# Patient Record
Sex: Female | Born: 1950 | Race: White | Hispanic: No | Marital: Married | Smoking: Never smoker
Health system: Southern US, Community
[De-identification: ages and names within clinical notes are randomized; demographics above are authoritative.]

---

## 1998-06-04 ENCOUNTER — Other Ambulatory Visit: Admission: RE | Admit: 1998-06-04 | Discharge: 1998-06-04 | Payer: Self-pay | Admitting: Obstetrics and Gynecology

## 2000-02-21 ENCOUNTER — Encounter: Admission: RE | Admit: 2000-02-21 | Discharge: 2000-02-21 | Payer: Self-pay | Admitting: Family Medicine

## 2000-02-21 ENCOUNTER — Encounter: Payer: Self-pay | Admitting: Family Medicine

## 2001-06-17 ENCOUNTER — Ambulatory Visit (HOSPITAL_COMMUNITY): Admission: RE | Admit: 2001-06-17 | Discharge: 2001-06-17 | Payer: Self-pay | Admitting: Gastroenterology

## 2004-11-28 ENCOUNTER — Ambulatory Visit (HOSPITAL_COMMUNITY): Admission: RE | Admit: 2004-11-28 | Discharge: 2004-11-28 | Payer: Self-pay | Admitting: Family Medicine

## 2004-12-06 ENCOUNTER — Ambulatory Visit (HOSPITAL_COMMUNITY): Admission: RE | Admit: 2004-12-06 | Discharge: 2004-12-06 | Payer: Self-pay | Admitting: Family Medicine

## 2005-01-07 ENCOUNTER — Ambulatory Visit (HOSPITAL_COMMUNITY): Admission: RE | Admit: 2005-01-07 | Discharge: 2005-01-07 | Payer: Self-pay | Admitting: Obstetrics & Gynecology

## 2005-01-07 ENCOUNTER — Encounter (INDEPENDENT_AMBULATORY_CARE_PROVIDER_SITE_OTHER): Payer: Self-pay | Admitting: *Deleted

## 2008-06-13 ENCOUNTER — Encounter: Payer: Self-pay | Admitting: Family Medicine

## 2008-12-21 ENCOUNTER — Encounter: Admission: RE | Admit: 2008-12-21 | Discharge: 2008-12-21 | Payer: Self-pay | Admitting: Gastroenterology

## 2009-09-11 ENCOUNTER — Ambulatory Visit: Payer: Self-pay | Admitting: Family Medicine

## 2009-09-11 DIAGNOSIS — M542 Cervicalgia: Secondary | ICD-10-CM | POA: Insufficient documentation

## 2009-09-11 DIAGNOSIS — IMO0001 Reserved for inherently not codable concepts without codable children: Secondary | ICD-10-CM | POA: Insufficient documentation

## 2009-09-11 DIAGNOSIS — K219 Gastro-esophageal reflux disease without esophagitis: Secondary | ICD-10-CM

## 2010-04-30 NOTE — Assessment & Plan Note (Signed)
Summary: NEW PT EST (TXFR FROM SMP)// RS   Vital Signs:  Patient profile:   60 year old female Menstrual status:  postmenopausal Height:      66.5 inches Weight:      139 pounds BMI:     22.18 Temp:     98.4 degrees F oral Pulse rate:   60 / minute Pulse rhythm:   regular Resp:     12 per minute BP sitting:   140 / 80  (left arm) Cuff size:   regular  Vitals Entered By: Sid Falcon LPN (September 11, 2009 10:34 AM) CC: New to establish, left neck & shoulder pain     Menstrual Status postmenopausal Last PAP Result normal   History of Present Illness: New pt to establish care.  PMH reviewed and signif for the following:  Hx of migraines.  Rarely has now.   Hx GERD.  Symptoms are intermittent and controlled with OTC meds.  She is aware of precipitating foods and tries to avoid.  No recent dysphagia.  Hx of fibromyalgia dx.  Symptoms are controlled when she gets adequate sleep and exercise.  Only takes very low dose Ambien but no other meds.  Still sees rheumatologist, Dr Corliss Skains. No hx of signif arthralgias.  Major issue now is almost 8 month hx of L sided neck pain with freq radiation to trapezius area.  Pain is sometimes sharp and moderate severity.  Has been seen at another practice and treated with NSAIDS and muscle relaxer without improvement.  PT without relief.  Exacerbated by lateral bending and rotation  of neck.  No alleviating factors.  Occ radiation toward L elbow.  No numbness or weakness.  No X-rays have been done at this time.    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[place] Married Never Smoked Alcohol  use-no Regular exercise-yes  Risk Factors: Exercise: yes (09/11/2009)  Risk Factors: Smoking Status: never (09/11/2009)  Past Medical History: Anemia end of 3rd pregnancy Fibromyalgia GERD Migraines  Past Surgical History: Ovaries removed 2006, benign mass PMH-FH-SH reviewed for relevance  Family History: Family History of Arthritis Father, age 73 cerbral aneurysm  Social History: Occupation:  PT Sports administrator Married Never Smoked Alcohol use-no Regular exercise-yes Smoking Status:  never Occupation:  employed Does Patient Exercise:  yes  Review of Systems  The patient denies anorexia, fever, weight loss, chest pain, syncope, dyspnea on exertion, peripheral edema, prolonged cough, headaches, abdominal pain, muscle weakness, and enlarged lymph nodes.    Physical Exam  General:  Well-developed,well-nourished,in no acute distress; alert,appropriate and cooperative throughout examination Head:  Normocephalic and atraumatic without obvious abnormalities. No apparent alopecia or balding. Eyes:  pupils equal, pupils round, and pupils reactive to light.   Mouth:  Oral mucosa and oropharynx without lesions or exudates.  Teeth in good repair. Neck:  No deformities, masses, or tenderness noted. Lungs:  Normal respiratory effort, chest expands symmetrically. Lungs are clear to auscultation, no crackles or wheezes. Heart:  Normal rate and regular rhythm. S1 and S2 normal without gallop, murmur, click, rub or other extra sounds. Msk:  no redness over joints.  normal ROM both shoulders. Extremities:  no UE edema or atrophy.  Normal distal wrist pulses. Neurologic:  alert & oriented X3, strength normal in all extremities, sensation intact to light touch, and DTRs symmetrical and normal.     Impression & Recommendations:  Problem # 1:  NECK PAIN, LEFT (ICD-723.1) Assessment New Chronic for several months now not relieved with conservative treatment.  ?cervical  nerve root impingement.  Given duration of symptoms start with plain films and may need MRI to further assess. Orders: T-Cervical Spine Comp 4 Views [id number])  Problem # 2:  FIBROMYALGIA (ICD-729.1)  Problem # 3:  GERD (ICD-530.81)  Complete Medication List: 1)  Ambien 10 Mg Tabs (Zolpidem tartrate) .... 1/2 tab at bedtime 2)  Fish Oil 1000 Mg Caps (Omega-3 fatty acids) .... Once daily 3)  Calcium Carbonate 600 Mg Tabs (Calcium carbonate) .... Once daily  Patient Instructions: 1)  Follow up promptly for any numbness or weakness.  Preventive Care Screening  Mammogram:    Date:  12/29/2008    Results:  normal   Colonoscopy:    Date:  03/31/2008    Results:  normal   Pap Smear:    Date:  04/01/2007    Results:  normal

## 2010-04-30 NOTE — Letter (Signed)
Summary: Records from [facility] [place]   Records from [facility] [place]   Imported By: Maryln Gottron 10/11/2009 13:43:34  _____________________________________________________________________  External Attachment:    Type:   Image     Comment:   External Document

## 2010-08-16 NOTE — Op Note (Signed)
. [facility]  Patient:    Shelby Ayala, Shelby Ayala Visit Number: [id number] MRN: [id number]          Service Type: END Location: ENDO Attending Physician:  Charna Elizabeth Dictated by:   Anselmo Rod, M.D. Proc. Date: 06/17/01 Admit Date:  06/17/2001 Discharge Date: 06/17/2001   CC:         [name], M.D., [facility]   Operative Report  DATE OF BIRTH:  [birth year].  REFERRING PHYSICIAN:  [name], M.D.  PROCEDURE PERFORMED:  Colonoscopy.  ENDOSCOPIST:  Anselmo Rod, M.D.  INSTRUMENT USED:  Olympus video colonoscope.  INDICATION FOR PROCEDURE:  Blood in stool in a 60 year old white female, to rule out colonic polyps, masses, hemorrhoids, etc.  PREPROCEDURE PREPARATION:  Informed consent was procured from the patient. The patient was fasted for eight hours prior to the procedure and prepped with a bottle of magnesium citrate and a gallon of NuLytely the night prior to procedure.  PREPROCEDURE PHYSICAL:  VITAL SIGNS:  The patient had stable vital signs.  NECK:  Neck supple.  CHEST:  Clear to auscultation.  S1 and S2 regular.  ABDOMEN:  Soft with normal bowel sounds.  DESCRIPTION OF PROCEDURE:  The patient was placed in the left lateral decubitus position and sedated with 80 mg of Demerol and 5 mg of Versed intravenously.  Once the patient was adequately sedated and maintained on low-flow oxygen and continuous cardiac monitoring, the Olympus video colonoscope was advanced from the rectum to the cecum without difficulty. There was some residual stool in the cecum.  Multiple washes were done.  The patients position was changed from the left lateral to the supine position to adequately visualize the cecal base.  There was evidence of left-sided diverticulosis.  Large left-sided diverticular were seen.  However, the cecum, right colon, and transverse colon appeared normal.  Small internal hemorrhoids were  appreciated on retroflexion of the rectum.  The rest of the colonic mucosa appeared healthy and without lesions.  No masses or polyps were seen.  IMPRESSION: 1. Left-sided diverticulosis. 2. Small internal hemorrhoids.  RECOMMENDATIONS: 1. A high-fiber diet has been discussed with the patient and brochures on    diverticular disease have been handed out for education. 2. Repeat colorectal cancer screening as recommended in the next five years    unless the patient develop abnormal symptoms in the interim. 3. Outpatient followup is advised in two weeks. Dictated by:   Anselmo Rod, M.D. Attending Physician:  Charna Elizabeth DD:  06/17/01 TD:  06/19/01 Job: [id number]

## 2010-08-16 NOTE — Op Note (Signed)
NAME:  Shelby Ayala, Shelby Ayala               ACCOUNT NO.:  [id number]   MEDICAL RECORD NO.:  [id number]          PATIENT TYPE:  AMB   LOCATION:  SDC                           FACILITY:  [facility]   PHYSICIAN:  Ilda Mori, M.D.   DATE OF BIRTH:  1950/09/18   DATE OF PROCEDURE:  01/07/2005  DATE OF DISCHARGE:                                 OPERATIVE REPORT   PREOPERATIVE DIAGNOSIS:  Persistent left adnexal mass.   POSTOPERATIVE DIAGNOSIS:  Persistent left adnexal mass.   PROCEDURE:  Laparoscopic bilateral salpingo-oophorectomy.   SURGEON:  Dr. Ilda Mori.   ASSISTANT:  Dr. Lodema Hong.   ANESTHESIA:  Was general endotracheal.   ESTIMATED BLOOD LOSS:  Minimal.   FINDINGS:  There was a 4 cm cyst on the left ovary. The right ovary and tube  looked perfectly normal. There was no pathology seen in the pelvis or lower  abdomen.   INDICATIONS:  This 60 year old gravida 3, para 3 who was noted to have a 4  cm mass on MRI, while being evaluated for lower back pain. The mass was  confirmed on ultrasound and CAT scan. 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 A CA-125 was 6.5.  The options were discussed with the patient and in view of the low potential  for malignancy one of the options was to observe, second option was to do  laparoscopic surgery and remove the left adnexa and the third option was to  proceed with laparoscopic surgery and remove both ovaries; left for  diagnostic purposes and the right as a prophylactic measure. The patient  opted to have a bilateral salpingo-oophorectomy.   PROCEDURE:  The patient is taken to the operating room and general  endotracheal anesthesia was induced. She was then placed in dorsal supine  position, taking care to placed legs while she was awake to minimize the  chance aggravating her chronic back pain. The patient was then  given general  anesthesia as described above earlier. The abdomen was prepped and draped in  sterile fashion. A bladder was catheterized. A Hulka tenaculum was placed  through the endocervical canal and affixed to the anterior lip of the  cervix. The surgeon regowned and gloved. Incision was made at the base of  the umbilicus and this incision was carried down to the fascia which was  then entered and extended by blunt and sharp dissection. The peritoneum was  then entered bluntly. The suture was placed in the fascial layer. A Hasson  cannula was placed through the incision and into the peritoneal cavity and  pneumoperitoneum was created and the laparoscope was introduced. Under  direct visualization accessory instruments were placed through 5 mm trocars  that were placed in the right and left lower quadrant. The pelvis was viewed  with the findings noted above. The infundibular pelvic ligament on the left  was then isolated cauterized with bipolar cautery and cut. This dissection  was continued until the left adnexa was freed from  the uterine ovarian  anastomosis as well as the infundibulopelvic ligament. The specimen was then  placed in the cul-de-sac. The cyst wall was intact. The identical procedure  was then carried out on the right adnexa. The laparoscope was removed from  the umbilical incision and a Endobag was placed through that incision and a  5 mm scope was placed through the right lower quadrant for visualization.  Both specimens were placed in the Endobag. The specimens were then removed  intact through the Endobag through the umbilical incision. Following this  the laparoscope was reintroduced and the pedicles were noted to be dry. The  procedure was then terminated. The suture that had been placed in the fascia  was then tied for fascial closure. The umbilical incision was closed with a  subcuticular 4-0 Dexon suture. The lower abdominal incisions were closed  with Dermabond  adhesive. The patient tolerated procedure well and left the  operating room in good condition.      Ilda Mori, M.D.  Electronically Signed     RK/MEDQ  D:  01/07/2005  T:  01/07/2005  Job:  [id number]

## 2010-11-07 ENCOUNTER — Other Ambulatory Visit: Payer: Self-pay | Admitting: Dermatology

## 2012-05-10 ENCOUNTER — Other Ambulatory Visit: Payer: Self-pay | Admitting: Family Medicine

## 2012-05-10 DIAGNOSIS — Z136 Encounter for screening for cardiovascular disorders: Secondary | ICD-10-CM

## 2012-05-14 ENCOUNTER — Other Ambulatory Visit: Payer: Self-pay

## 2012-05-18 ENCOUNTER — Other Ambulatory Visit: Payer: Self-pay | Admitting: Family Medicine

## 2012-05-18 ENCOUNTER — Ambulatory Visit
Admission: RE | Admit: 2012-05-18 | Discharge: 2012-05-18 | Disposition: A | Discharge status: Home / Self Care | Payer: 59 | Source: Ambulatory Visit | Attending: Family Medicine | Admitting: Family Medicine

## 2012-05-18 DIAGNOSIS — Z136 Encounter for screening for cardiovascular disorders: Secondary | ICD-10-CM

## 2016-12-03 ENCOUNTER — Inpatient Hospital Stay (HOSPITAL_COMMUNITY): Payer: Medicare Other

## 2016-12-03 ENCOUNTER — Emergency Department (HOSPITAL_COMMUNITY): Payer: Medicare Other

## 2016-12-03 ENCOUNTER — Emergency Department (HOSPITAL_COMMUNITY): Payer: Self-pay

## 2016-12-03 ENCOUNTER — Emergency Department (HOSPITAL_COMMUNITY)
Admission: EM | Admit: 2016-12-03 | Discharge: 2016-12-03 | Disposition: A | Discharge status: Home / Self Care | Payer: Self-pay | Attending: Emergency Medicine | Admitting: Emergency Medicine

## 2016-12-03 ENCOUNTER — Encounter (HOSPITAL_COMMUNITY): Payer: Self-pay | Admitting: *Deleted

## 2016-12-03 ENCOUNTER — Inpatient Hospital Stay (HOSPITAL_COMMUNITY): Payer: Medicare Other | Admitting: Anesthesiology

## 2016-12-03 ENCOUNTER — Other Ambulatory Visit: Payer: Self-pay

## 2016-12-03 ENCOUNTER — Inpatient Hospital Stay (HOSPITAL_COMMUNITY)
Admission: EM | Admit: 2016-12-03 | Discharge: 2016-12-22 | DRG: 020 | Disposition: A | Discharge status: Hospice | Payer: Medicare Other | Attending: Neurosurgery | Admitting: Neurosurgery

## 2016-12-03 ENCOUNTER — Encounter (HOSPITAL_COMMUNITY)
Admission: EM | Disposition: A | Discharge status: Hospice | Payer: Self-pay | Source: Home / Self Care | Attending: Neurosurgery

## 2016-12-03 DIAGNOSIS — A419 Sepsis, unspecified organism: Secondary | ICD-10-CM | POA: Diagnosis not present

## 2016-12-03 DIAGNOSIS — R6521 Severe sepsis with septic shock: Secondary | ICD-10-CM | POA: Diagnosis not present

## 2016-12-03 DIAGNOSIS — B961 Klebsiella pneumoniae [K. pneumoniae] as the cause of diseases classified elsewhere: Secondary | ICD-10-CM | POA: Diagnosis present

## 2016-12-03 DIAGNOSIS — Z23 Encounter for immunization: Secondary | ICD-10-CM | POA: Diagnosis present

## 2016-12-03 DIAGNOSIS — D696 Thrombocytopenia, unspecified: Secondary | ICD-10-CM | POA: Diagnosis present

## 2016-12-03 DIAGNOSIS — J9601 Acute respiratory failure with hypoxia: Secondary | ICD-10-CM | POA: Diagnosis not present

## 2016-12-03 DIAGNOSIS — Z7189 Other specified counseling: Secondary | ICD-10-CM | POA: Diagnosis not present

## 2016-12-03 DIAGNOSIS — E870 Hyperosmolality and hypernatremia: Secondary | ICD-10-CM | POA: Diagnosis not present

## 2016-12-03 DIAGNOSIS — R0602 Shortness of breath: Secondary | ICD-10-CM | POA: Diagnosis not present

## 2016-12-03 DIAGNOSIS — I609 Nontraumatic subarachnoid hemorrhage, unspecified: Secondary | ICD-10-CM | POA: Diagnosis present

## 2016-12-03 DIAGNOSIS — N390 Urinary tract infection, site not specified: Secondary | ICD-10-CM | POA: Diagnosis present

## 2016-12-03 DIAGNOSIS — R739 Hyperglycemia, unspecified: Secondary | ICD-10-CM | POA: Diagnosis not present

## 2016-12-03 DIAGNOSIS — Z0189 Encounter for other specified special examinations: Secondary | ICD-10-CM

## 2016-12-03 DIAGNOSIS — I729 Aneurysm of unspecified site: Secondary | ICD-10-CM

## 2016-12-03 DIAGNOSIS — I639 Cerebral infarction, unspecified: Secondary | ICD-10-CM | POA: Diagnosis not present

## 2016-12-03 DIAGNOSIS — G9340 Encephalopathy, unspecified: Secondary | ICD-10-CM | POA: Diagnosis present

## 2016-12-03 DIAGNOSIS — E872 Acidosis: Secondary | ICD-10-CM | POA: Diagnosis not present

## 2016-12-03 DIAGNOSIS — Z452 Encounter for adjustment and management of vascular access device: Secondary | ICD-10-CM

## 2016-12-03 DIAGNOSIS — Z978 Presence of other specified devices: Secondary | ICD-10-CM

## 2016-12-03 DIAGNOSIS — I604 Nontraumatic subarachnoid hemorrhage from basilar artery: Principal | ICD-10-CM | POA: Diagnosis present

## 2016-12-03 DIAGNOSIS — J9600 Acute respiratory failure, unspecified whether with hypoxia or hypercapnia: Secondary | ICD-10-CM | POA: Diagnosis not present

## 2016-12-03 DIAGNOSIS — R197 Diarrhea, unspecified: Secondary | ICD-10-CM | POA: Diagnosis not present

## 2016-12-03 DIAGNOSIS — Z9289 Personal history of other medical treatment: Secondary | ICD-10-CM

## 2016-12-03 DIAGNOSIS — E876 Hypokalemia: Secondary | ICD-10-CM | POA: Diagnosis not present

## 2016-12-03 DIAGNOSIS — T380X5A Adverse effect of glucocorticoids and synthetic analogues, initial encounter: Secondary | ICD-10-CM | POA: Diagnosis present

## 2016-12-03 DIAGNOSIS — J690 Pneumonitis due to inhalation of food and vomit: Secondary | ICD-10-CM | POA: Diagnosis not present

## 2016-12-03 DIAGNOSIS — B9620 Unspecified Escherichia coli [E. coli] as the cause of diseases classified elsewhere: Secondary | ICD-10-CM | POA: Diagnosis present

## 2016-12-03 DIAGNOSIS — Z66 Do not resuscitate: Secondary | ICD-10-CM | POA: Diagnosis not present

## 2016-12-03 DIAGNOSIS — E878 Other disorders of electrolyte and fluid balance, not elsewhere classified: Secondary | ICD-10-CM | POA: Diagnosis not present

## 2016-12-03 DIAGNOSIS — J15211 Pneumonia due to Methicillin susceptible Staphylococcus aureus: Secondary | ICD-10-CM | POA: Diagnosis not present

## 2016-12-03 DIAGNOSIS — M7989 Other specified soft tissue disorders: Secondary | ICD-10-CM | POA: Diagnosis not present

## 2016-12-03 DIAGNOSIS — I608 Other nontraumatic subarachnoid hemorrhage: Secondary | ICD-10-CM | POA: Diagnosis not present

## 2016-12-03 DIAGNOSIS — G919 Hydrocephalus, unspecified: Secondary | ICD-10-CM | POA: Diagnosis present

## 2016-12-03 DIAGNOSIS — Z4659 Encounter for fitting and adjustment of other gastrointestinal appliance and device: Secondary | ICD-10-CM

## 2016-12-03 DIAGNOSIS — Z515 Encounter for palliative care: Secondary | ICD-10-CM | POA: Diagnosis not present

## 2016-12-03 HISTORY — PX: IR TRANSCATH/EMBOLIZ: IMG695

## 2016-12-03 HISTORY — PX: IR ANGIO VERTEBRAL SEL VERTEBRAL UNI L MOD SED: IMG5367

## 2016-12-03 HISTORY — PX: IR NEURO EACH ADD'L AFTER BASIC UNI LEFT (MS): IMG5373

## 2016-12-03 HISTORY — PX: IR 3D INDEPENDENT WKST: IMG2385

## 2016-12-03 HISTORY — PX: IR ANGIO INTRA EXTRACRAN SEL INTERNAL CAROTID BILAT MOD SED: IMG5363

## 2016-12-03 HISTORY — PX: RADIOLOGY WITH ANESTHESIA: SHX6223

## 2016-12-03 HISTORY — PX: IR ANGIOGRAM FOLLOW UP STUDY: IMG697

## 2016-12-03 LAB — CBC
HCT: 41.1 % (ref 36.0–46.0)
HCT: 44.8 % (ref 36.0–46.0)
HEMOGLOBIN: 14.8 g/dL (ref 12.0–15.0)
Hemoglobin: 13.7 g/dL (ref 12.0–15.0)
MCH: 30.8 pg (ref 26.0–34.0)
MCH: 31.1 pg (ref 26.0–34.0)
MCHC: 33.3 g/dL (ref 30.0–36.0)
MCHC: 33 g/dL (ref 30.0–36.0)
MCV: 92.4 fL (ref 78.0–100.0)
MCV: 94.1 fL (ref 78.0–100.0)
PLATELETS: 210 10*3/uL (ref 150–400)
Platelets: 138 10*3/uL — ABNORMAL LOW (ref 150–400)
RBC: 4.45 MIL/uL (ref 3.87–5.11)
RBC: 4.76 MIL/uL (ref 3.87–5.11)
RDW: 12.8 % (ref 11.5–15.5)
RDW: 12.7 % (ref 11.5–15.5)
WBC: 11.5 10*3/uL — ABNORMAL HIGH (ref 4.0–10.5)
WBC: 17.4 10*3/uL — ABNORMAL HIGH (ref 4.0–10.5)

## 2016-12-03 LAB — DIFFERENTIAL
BASOS PCT: 0 %
Basophils Absolute: 0 10*3/uL (ref 0.0–0.1)
EOS ABS: 0.1 10*3/uL (ref 0.0–0.7)
EOS PCT: 1 %
LYMPHS PCT: 41 %
Lymphs Abs: 4.7 10*3/uL — ABNORMAL HIGH (ref 0.7–4.0)
MONO ABS: 0.9 10*3/uL (ref 0.1–1.0)
Monocytes Relative: 8 %
NEUTROS PCT: 50 %
Neutro Abs: 5.7 10*3/uL (ref 1.7–7.7)

## 2016-12-03 LAB — COMPREHENSIVE METABOLIC PANEL
ALBUMIN: 4.1 g/dL (ref 3.5–5.0)
ALT: 27 U/L (ref 14–54)
ANION GAP: 15 (ref 5–15)
AST: 36 U/L (ref 15–41)
Alkaline Phosphatase: 88 U/L (ref 38–126)
BUN: 8 mg/dL (ref 6–20)
CO2: 19 mmol/L — AB (ref 22–32)
Calcium: 9.2 mg/dL (ref 8.9–10.3)
Chloride: 102 mmol/L (ref 101–111)
Creatinine, Ser: 0.69 mg/dL (ref 0.44–1.00)
GFR calc non Af Amer: >60 mL/min (ref >60)
GLUCOSE: 192 mg/dL — AB (ref 65–99)
POTASSIUM: 3.2 mmol/L — AB (ref 3.5–5.1)
SODIUM: 136 mmol/L (ref 135–145)
Total Bilirubin: 1 mg/dL (ref 0.3–1.2)
Total Protein: 6.8 g/dL (ref 6.5–8.1)

## 2016-12-03 LAB — I-STAT TROPONIN, ED: Troponin i, poc: 0 ng/mL (ref 0.00–0.08)

## 2016-12-03 LAB — I-STAT CHEM 8, ED
BUN: 11 mg/dL (ref 6–20)
CALCIUM ION: 1.06 mmol/L — AB (ref 1.15–1.40)
CHLORIDE: 103 mmol/L (ref 101–111)
Creatinine, Ser: 0.5 mg/dL (ref 0.44–1.00)
GLUCOSE: 191 mg/dL — AB (ref 65–99)
HEMATOCRIT: 42 % (ref 36.0–46.0)
Hemoglobin: 14.3 g/dL (ref 12.0–15.0)
Potassium: 3.1 mmol/L — ABNORMAL LOW (ref 3.5–5.1)
SODIUM: 137 mmol/L (ref 135–145)
TCO2: 22 mmol/L (ref 22–32)

## 2016-12-03 LAB — PROTIME-INR
INR: 0.91
INR: 0.91
PROTHROMBIN TIME: 12.1 s (ref 11.4–15.2)
Prothrombin Time: 12.2 seconds (ref 11.4–15.2)

## 2016-12-03 LAB — SURGICAL PCR SCREEN
MRSA, PCR: NEGATIVE
Staphylococcus aureus: NEGATIVE

## 2016-12-03 LAB — I-STAT CG4 LACTIC ACID, ED: LACTIC ACID, VENOUS: 2.85 mmol/L — AB (ref 0.5–1.9)

## 2016-12-03 LAB — APTT
aPTT: 25 seconds (ref 24–36)
aPTT: 24 seconds (ref 24–36)

## 2016-12-03 SURGERY — RADIOLOGY WITH ANESTHESIA
Anesthesia: General

## 2016-12-03 MED ORDER — ASPIRIN 81 MG PO CHEW
81.0000 mg | CHEWABLE_TABLET | Freq: Every day | ORAL | Status: DC
  Administered 2016-12-04 – 2016-12-08 (×5): 81 mg via ORAL
  Filled 2016-12-03 (×5): qty 1

## 2016-12-03 MED ORDER — ACETAMINOPHEN-CODEINE #3 300-30 MG PO TABS: 1.0000 | ORAL_TABLET | ORAL | Status: DC | PRN

## 2016-12-03 MED ORDER — MIDAZOLAM HCL 2 MG/2ML IJ SOLN
2.0000 mg | Freq: Once | INTRAMUSCULAR | Status: AC
  Administered 2016-12-03: 2 mg via INTRAVENOUS

## 2016-12-03 MED ORDER — ACETAMINOPHEN 325 MG PO TABS
650.0000 mg | ORAL_TABLET | ORAL | Status: DC | PRN
  Administered 2016-12-08: 650 mg via ORAL
  Filled 2016-12-03: qty 2

## 2016-12-03 MED ORDER — MORPHINE SULFATE (PF) 4 MG/ML IV SOLN
INTRAVENOUS | Status: AC
Start: 2016-12-03 — End: 2016-12-03
  Filled 2016-12-03: qty 2

## 2016-12-03 MED ORDER — PHENYLEPHRINE HCL 10 MG/ML IJ SOLN
INTRAMUSCULAR | Status: DC | PRN
  Administered 2016-12-03: 40 ug via INTRAVENOUS

## 2016-12-03 MED ORDER — PHENYLEPHRINE HCL 10 MG/ML IJ SOLN
INTRAMUSCULAR | Status: DC | PRN
  Administered 2016-12-03: 25 ug/min via INTRAVENOUS

## 2016-12-03 MED ORDER — PANTOPRAZOLE SODIUM 40 MG PO PACK
40.0000 mg | PACK | Freq: Every day | ORAL | Status: DC
  Administered 2016-12-04 – 2016-12-08 (×5): 40 mg
  Filled 2016-12-03 (×6): qty 20

## 2016-12-03 MED ORDER — PANTOPRAZOLE SODIUM 40 MG PO TBEC: 40.0000 mg | DELAYED_RELEASE_TABLET | Freq: Every day | ORAL | Status: DC

## 2016-12-03 MED ORDER — MIDAZOLAM HCL 2 MG/2ML IJ SOLN
INTRAMUSCULAR | Status: AC
  Filled 2016-12-03: qty 2

## 2016-12-03 MED ORDER — ACETAMINOPHEN 650 MG RE SUPP: 650.0000 mg | RECTAL | Status: DC | PRN

## 2016-12-03 MED ORDER — IOPAMIDOL (ISOVUE-300) INJECTION 61%
INTRAVENOUS | Status: AC
  Filled 2016-12-03: qty 150

## 2016-12-03 MED ORDER — NIMODIPINE 30 MG PO CAPS: 60.0000 mg | ORAL_CAPSULE | ORAL | Status: DC

## 2016-12-03 MED ORDER — IOPAMIDOL (ISOVUE-300) INJECTION 61%
INTRAVENOUS | Status: AC
  Administered 2016-12-03: 14 mL
  Filled 2016-12-03: qty 150

## 2016-12-03 MED ORDER — ONDANSETRON HCL 4 MG/2ML IJ SOLN
INTRAMUSCULAR | Status: DC | PRN
  Administered 2016-12-03: 4 mg via INTRAVENOUS

## 2016-12-03 MED ORDER — MORPHINE SULFATE (PF) 4 MG/ML IV SOLN
6.0000 mg | Freq: Once | INTRAVENOUS | Status: AC
  Administered 2016-12-03: 6 mg via INTRAVENOUS

## 2016-12-03 MED ORDER — SODIUM CHLORIDE 0.9 % IV SOLN
INTRAVENOUS | Status: DC | PRN
  Administered 2016-12-03: 17:00:00 via INTRAVENOUS

## 2016-12-03 MED ORDER — LIDOCAINE HCL (CARDIAC) 20 MG/ML IV SOLN
INTRAVENOUS | Status: DC | PRN
  Administered 2016-12-03: 60 mg via INTRAVENOUS

## 2016-12-03 MED ORDER — NICARDIPINE HCL IN NACL 20-0.86 MG/200ML-% IV SOLN
INTRAVENOUS | Status: AC
  Administered 2016-12-03: 5 mg
  Filled 2016-12-03: qty 200

## 2016-12-03 MED ORDER — IOPAMIDOL (ISOVUE-370) INJECTION 76%
INTRAVENOUS | Status: AC
  Filled 2016-12-03: qty 50

## 2016-12-03 MED ORDER — ACETAMINOPHEN 160 MG/5ML PO SOLN
650.0000 mg | ORAL | Status: DC | PRN
  Administered 2016-12-04 – 2016-12-11 (×5): 650 mg
  Filled 2016-12-03 (×5): qty 20.3

## 2016-12-03 MED ORDER — ONDANSETRON HCL 4 MG/2ML IJ SOLN: 4.0000 mg | Freq: Once | INTRAMUSCULAR | Status: DC

## 2016-12-03 MED ORDER — NICARDIPINE HCL IN NACL 20-0.86 MG/200ML-% IV SOLN: 0.0000 mg/h | INTRAVENOUS | Status: DC

## 2016-12-03 MED ORDER — STROKE: EARLY STAGES OF RECOVERY BOOK
Freq: Once | Status: AC
  Administered 2016-12-03: 13:00:00
  Filled 2016-12-03: qty 1

## 2016-12-03 MED ORDER — ONDANSETRON 4 MG PO TBDP: 4.0000 mg | ORAL_TABLET | Freq: Four times a day (QID) | ORAL | Status: DC | PRN

## 2016-12-03 MED ORDER — PROPOFOL 10 MG/ML IV BOLUS
INTRAVENOUS | Status: DC | PRN
  Administered 2016-12-03: 30 mg via INTRAVENOUS
  Administered 2016-12-03 (×3): 20 mg via INTRAVENOUS

## 2016-12-03 MED ORDER — SUGAMMADEX SODIUM 200 MG/2ML IV SOLN
INTRAVENOUS | Status: DC | PRN
  Administered 2016-12-03: 125.6 mg via INTRAVENOUS

## 2016-12-03 MED ORDER — HYDROMORPHONE HCL 1 MG/ML IJ SOLN
0.5000 mg | Freq: Once | INTRAMUSCULAR | Status: AC
  Administered 2016-12-03: 0.5 mg via INTRAVENOUS

## 2016-12-03 MED ORDER — DOCUSATE SODIUM 100 MG PO CAPS: 100.0000 mg | ORAL_CAPSULE | Freq: Two times a day (BID) | ORAL | Status: DC

## 2016-12-03 MED ORDER — ROCURONIUM BROMIDE 100 MG/10ML IV SOLN
INTRAVENOUS | Status: DC | PRN
  Administered 2016-12-03: 50 mg via INTRAVENOUS
  Administered 2016-12-03: 30 mg via INTRAVENOUS
  Administered 2016-12-03: 20 mg via INTRAVENOUS

## 2016-12-03 MED ORDER — MORPHINE SULFATE (PF) 4 MG/ML IV SOLN: 1.0000 mg | INTRAVENOUS | Status: DC | PRN

## 2016-12-03 MED ORDER — PHENYLEPHRINE 40 MCG/ML (10ML) SYRINGE FOR IV PUSH (FOR BLOOD PRESSURE SUPPORT)
PREFILLED_SYRINGE | INTRAVENOUS | Status: AC
  Filled 2016-12-03: qty 20

## 2016-12-03 MED ORDER — NIMODIPINE 60 MG/20ML PO SOLN
60.0000 mg | ORAL | Status: DC
  Administered 2016-12-04 – 2016-12-09 (×35): 60 mg
  Filled 2016-12-03 (×33): qty 20

## 2016-12-03 MED ORDER — MIDAZOLAM HCL 2 MG/2ML IJ SOLN
INTRAMUSCULAR | Status: AC
  Filled 2016-12-03: qty 4

## 2016-12-03 MED ORDER — FENTANYL CITRATE (PF) 250 MCG/5ML IJ SOLN
INTRAMUSCULAR | Status: AC
  Filled 2016-12-03: qty 5

## 2016-12-03 MED ORDER — ONDANSETRON HCL 4 MG/2ML IJ SOLN: 4.0000 mg | Freq: Four times a day (QID) | INTRAMUSCULAR | Status: DC | PRN

## 2016-12-03 MED ORDER — LABETALOL HCL 5 MG/ML IV SOLN
20.0000 mg | Freq: Once | INTRAVENOUS | Status: AC
  Administered 2016-12-08: 20 mg via INTRAVENOUS
  Filled 2016-12-03: qty 4

## 2016-12-03 MED ORDER — FENTANYL CITRATE (PF) 100 MCG/2ML IJ SOLN
INTRAMUSCULAR | Status: DC | PRN
Start: 2016-12-03 — End: 2016-12-03
  Administered 2016-12-03 (×2): 25 ug via INTRAVENOUS
  Administered 2016-12-03: 150 ug via INTRAVENOUS

## 2016-12-03 MED ORDER — SODIUM CHLORIDE 0.9 % IV SOLN
INTRAVENOUS | Status: DC
  Administered 2016-12-03 – 2016-12-07 (×9): via INTRAVENOUS

## 2016-12-03 MED ORDER — IOPAMIDOL (ISOVUE-300) INJECTION 61%
INTRAVENOUS | Status: AC
  Administered 2016-12-03: 65 mL
  Filled 2016-12-03: qty 100

## 2016-12-03 MED ORDER — HYDROMORPHONE HCL 1 MG/ML IJ SOLN
INTRAMUSCULAR | Status: AC
  Filled 2016-12-03: qty 1

## 2016-12-03 MED ORDER — ONDANSETRON HCL 4 MG/2ML IJ SOLN
INTRAMUSCULAR | Status: AC
  Administered 2016-12-03: 4 mg via INTRAVENOUS
  Filled 2016-12-03: qty 2

## 2016-12-03 NOTE — ED Notes (Signed)
Transport to CT

## 2016-12-03 NOTE — Progress Notes (Signed)
Pts admission history reviewed. Briefly, presented with sudden severe HA while on the treadmill. Has associated double vision. No extremity N/T/W. Father had cerebral aneurysm at age 65. No smoking history, no HTN. Not on anticoagulation/antiplatelet agents.  EXAM:  BP 126/69   Pulse (!) 48   Temp (!) 95.5 F (35.3 C) (Temporal)   Resp 14   Ht 5\' 7"  (1.702 m)   Wt 62.8 kg (138 lb 7.2 oz)   SpO2 100%   BMI 21.68 kg/m   Drowsy be easily arousable Speech fluent, appropriate Partial left CN III palsy Good strength throughout  IMAGING: CTH demonstrates thick, diffuse SAH involving cisterna magna, basal cisterns, and sylvian fissures. Early ventriculomegaly with appearance of temporal horns.  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  I spoke at length with the patient and family regarding the imaging findings thus far. I explained to them that the definitive diagnosis is made by diagnostic angiogram. I also explained to them the possible treatment options for intracranial aneurysms including endovascular coiling and open clip ligation. The risks of the angiogram, coiling, and surgical clipping were also reviewed to include stroke and aneurysm re-rupture leading to weakness/paralysis/coma/death, infection, SZ, hydrocephalus.   The patient and family understood our discussion and her husband provided consent to proceed with diagnostic angiogram and the appropriate treatment for any identified aneurysm.

## 2016-12-03 NOTE — Progress Notes (Addendum)
8 Fr sheath removed from R femoral artery by Loran Sentersoy Hanner, RTR. Hemostasis achieved using Exoseal closure device. Groin level 0, 4+RDP, 1+RPT.

## 2016-12-03 NOTE — Anesthesia Procedure Notes (Signed)
Arterial Line Insertion Start/End9/07/2016 5:00 PM Performed by: Isidore MoosPAXTON, Karenann Mcgrory A, CRNA  Preanesthetic checklist: patient identified, IV checked and surgical consent Lidocaine 1% used for infiltration Left, radial was placed Catheter size: 20 G Hand hygiene performed  and maximum sterile barriers used   Attempts: 1 Procedure performed without using ultrasound guided technique. Following insertion, dressing applied. Post procedure assessment: normal  Patient tolerated the procedure well with no immediate complications.

## 2016-12-03 NOTE — ED Notes (Signed)
Patient is talking some with family states her head is feeling a little better.

## 2016-12-03 NOTE — ED Notes (Signed)
[facility]

## 2016-12-03 NOTE — Anesthesia Preprocedure Evaluation (Signed)
Anesthesia Evaluation  Patient identified by MRN, date of birth, ID band Patient confused    Reviewed: Unable to perform ROS - Chart review onlyPreop documentation limited or incomplete due to emergent nature of procedure.  History of Anesthesia Complications Negative for: history of anesthetic complications  Airway Mallampati: II  TM Distance: >3 FB Neck ROM: Full    Dental  (+) Teeth Intact   Pulmonary    breath sounds clear to auscultation       Cardiovascular  Rhythm:Regular     Neuro/Psych CVA, Residual Symptoms    GI/Hepatic   Endo/Other    Renal/GU      Musculoskeletal   Abdominal   Peds  Hematology   Anesthesia Other Findings   Reproductive/Obstetrics                             Anesthesia Physical Anesthesia Plan  ASA: IV and emergent  Anesthesia Plan: General   Post-op Pain Management:    Induction:   PONV Risk Score and Plan: 3 and Ondansetron, Dexamethasone and Treatment may vary due to age or medical condition  Airway Management Planned: Oral ETT  Additional Equipment: Arterial line  Intra-op Plan:   Post-operative Plan: Extubation in OR and Possible Post-op intubation/ventilation  Informed Consent:   Only emergency history available and History available from chart only  Plan Discussed with: CRNA and Surgeon  Anesthesia Plan Comments:         Anesthesia Quick Evaluation

## 2016-12-03 NOTE — ED Notes (Signed)
Husband has been to bedside and spoke with neurologist.

## 2016-12-03 NOTE — Transfer of Care (Signed)
Immediate Anesthesia Transfer of Care Note  Patient: Shelby Ayala  Procedure(s) Performed: Procedure(s): RADIOLOGY WITH ANESTHESIA (N/A)  Patient Location: PACU and NICU  Anesthesia Type:General  Level of Consciousness: awake  Airway & Oxygen Therapy: Patient Spontanous Breathing  Post-op Assessment: Report given to RN and Post -op Vital signs reviewed and stable  Post vital signs: Reviewed and stable  Last Vitals:  Vitals:   12/03/16 2100 12/03/16 2115  BP: 125/71 128/67  Pulse:    Resp: 12 14  Temp:    SpO2: 99% 100%    Last Pain:  Vitals:   12/03/16 1500  TempSrc: Rectal         Complications: No apparent anesthesia complications

## 2016-12-03 NOTE — Code Documentation (Addendum)
66[facility]  Patient was LKW at 1000 when she was on the treadmill.  She was witnessed getting off of the treadmill and sat down and was then noted to become unresponsive.  EMS called and assessed patient to be nonverbal with minimal response and activated a code stroke.  EMS report HR in the 60s during transport.  Patient to Trauma B on arrival to the ED.  Stroke team to the bedside.  Initial NIHSS 23, see documentation for details and code stroke times.  Patient remained obtunded, nonverbal with some spontaneous movements in BUE on exam.  Pupils 3mm and reactive bilaterally.  HR 50s.  Dr. Denton LankSteinl at the bedside and cleared patient for CT.  Patient to CT with team.  HR now in the 40s.  CT showing diffuse subarachnoid hemorrhage with intraventricular extension.  HOB elevated.  Patient transported back to Trauma B.  Patient nauseous and given Zofran IVP.  Patient hypertensive and Cardene gtt started and titrated, see documentation.  SBP goal <150 per Dr. Wilford CornerArora.  Once BP in parameters, patient transferred back to CT for CTA head and neck.  Patient monitored during imaging and tolerated well.  Patient transferred back to Trauma B.  NIHSS now 9, patient lethargic but responding to questions and moving all extremities.  NS consulted and to the bedside.  Patient for catheter angiogram and will be admitted to ICU per Dr. Franky Machoabbell.  Family updated on plan of care at the bedside.  Of note, patient with family h/o cerebral aneurysm.  4N ICU Charge RN made aware of need for a bed.  Bedside handoff with ED RN Tresa EndoKelly.

## 2016-12-03 NOTE — ED Notes (Signed)
Report called to Heather

## 2016-12-03 NOTE — ED Triage Notes (Signed)
Pt was witnessed by MD on scene at gym where patient was on the treadmill and came off abruptive, placed self on floor and started posturing, then became agonal.  Pt was bagged on the scene and nasal trumpet placed in right nare.  Pt has been nonverbal and is having decreased purposeful movement on the left.  CBG 105, BP 140/90, SR.  No fall and no trauma per ems.  Pt seen by EDP on arrival.

## 2016-12-03 NOTE — H&P (Signed)
Shelby Ayala is an 66 y.o. female.   Chief Complaint: Subarachnoid hemorrhage HPI: 66 yo whom while working out on a treadmill stepped off the treadmill and was unresponsive. Head ct showed large amount of subarachnoid blood. History reviewed. No pertinent past medical history.  History reviewed. No pertinent surgical history.  No family history on file. Social History:  has no tobacco, alcohol, and drug history on file.  Allergies: No Known Allergies   (Not in a hospital admission)  Results for orders placed or performed during the hospital encounter of 12/03/16 (from the past 48 hour(s))  Protime-INR     Status: None   Collection Time: 12/03/16 10:47 AM  Result Value Ref Range   Prothrombin Time 12.1 11.4 - 15.2 seconds   INR 0.91   APTT     Status: None   Collection Time: 12/03/16 10:47 AM  Result Value Ref Range   aPTT 25 24 - 36 seconds  CBC     Status: Abnormal   Collection Time: 12/03/16 10:47 AM  Result Value Ref Range   WBC 11.5 (H) 4.0 - 10.5 K/uL   RBC 4.45 3.87 - 5.11 MIL/uL   Hemoglobin 13.7 12.0 - 15.0 g/dL   HCT 41.1 36.0 - 46.0 %   MCV 92.4 78.0 - 100.0 fL   MCH 30.8 26.0 - 34.0 pg   MCHC 33.3 30.0 - 36.0 g/dL   RDW 12.8 11.5 - 15.5 %   Platelets 210 150 - 400 K/uL  Differential     Status: Abnormal   Collection Time: 12/03/16 10:47 AM  Result Value Ref Range   Neutrophils Relative % 50 %   Neutro Abs 5.7 1.7 - 7.7 K/uL   Lymphocytes Relative 41 %   Lymphs Abs 4.7 (H) 0.7 - 4.0 K/uL   Monocytes Relative 8 %   Monocytes Absolute 0.9 0.1 - 1.0 K/uL   Eosinophils Relative 1 %   Eosinophils Absolute 0.1 0.0 - 0.7 K/uL   Basophils Relative 0 %   Basophils Absolute 0.0 0.0 - 0.1 K/uL  Comprehensive metabolic panel     Status: Abnormal   Collection Time: 12/03/16 10:47 AM  Result Value Ref Range   Sodium 136 135 - 145 mmol/L   Potassium 3.2 (L) 3.5 - 5.1 mmol/L   Chloride 102 101 - 111 mmol/L   CO2 19 (L) 22 - 32 mmol/L   Glucose, Bld 192 (H) 65 -  99 mg/dL   BUN 8 6 - 20 mg/dL   Creatinine, Ser 0.69 0.44 - 1.00 mg/dL   Calcium 9.2 8.9 - 10.3 mg/dL   Total Protein 6.8 6.5 - 8.1 g/dL   Albumin 4.1 3.5 - 5.0 g/dL   AST 36 15 - 41 U/L   ALT 27 14 - 54 U/L   Alkaline Phosphatase 88 38 - 126 U/L   Total Bilirubin 1.0 0.3 - 1.2 mg/dL   GFR calc non Af Amer >60 >60 mL/min   GFR calc Af Amer >60 >60 mL/min    Comment: (NOTE) The eGFR has been calculated using the CKD EPI equation. This calculation has not been validated in all clinical situations. eGFR's persistently <60 mL/min signify possible Chronic Kidney Disease.    Anion gap 15 5 - 15  I-stat troponin, ED     Status: None   Collection Time: 12/03/16 10:53 AM  Result Value Ref Range   Troponin i, poc 0.00 0.00 - 0.08 ng/mL   Comment 3  Comment: Due to the release kinetics of cTnI, a negative result within the first hours of the onset of symptoms does not rule out myocardial infarction with certainty. If myocardial infarction is still suspected, repeat the test at appropriate intervals.   I-Stat Chem 8, ED     Status: Abnormal   Collection Time: 12/03/16 10:55 AM  Result Value Ref Range   Sodium 137 135 - 145 mmol/L   Potassium 3.1 (L) 3.5 - 5.1 mmol/L   Chloride 103 101 - 111 mmol/L   BUN 11 6 - 20 mg/dL   Creatinine, Ser 0.50 0.44 - 1.00 mg/dL   Glucose, Bld 191 (H) 65 - 99 mg/dL   Calcium, Ion 1.06 (L) 1.15 - 1.40 mmol/L   TCO2 22 22 - 32 mmol/L   Hemoglobin 14.3 12.0 - 15.0 g/dL   HCT 42.0 36.0 - 46.0 %  I-Stat CG4 Lactic Acid, ED     Status: Abnormal   Collection Time: 12/03/16 10:58 AM  Result Value Ref Range   Lactic Acid, Venous 2.85 (HH) 0.5 - 1.9 mmol/L   Comment NOTIFIED PHYSICIAN    Ct Angio Head W Or Wo Contrast  Result Date: 12/03/2016 CLINICAL DATA:  66 year old female who collapsed while on treadmill. Family history of intracranial aneurysm, and aneurysmal appearing subarachnoid hemorrhage on noncontrast head CT today. EXAM: CT  ANGIOGRAPHY HEAD AND NECK TECHNIQUE: Multidetector CT imaging of the head and neck was performed using the standard protocol during bolus administration of intravenous contrast. Multiplanar CT image reconstructions and MIPs were obtained to evaluate the vascular anatomy. Carotid stenosis measurements (when applicable) are obtained utilizing NASCET criteria, using the distal internal carotid diameter as the denominator. CONTRAST:  100 mL Isovue 370 COMPARISON:  Head CT without contrast 1051 hours today. FINDINGS: CTA NECK Skeleton: Cervical spine multilevel left side facet and lower disc and endplate chronic degeneration. Mild thoracic scoliosis. No acute osseous abnormality identified. Upper chest: Negative lung apices. Negative visualized superior mediastinum. Other neck: Negative.  No cervical lymphadenopathy. Aortic arch: 3 vessel arch configuration with minimal arch atherosclerosis and no great vessel origin stenosis. Right carotid system: Negative aside from mild to moderate tortuosity and dolichoectasia of the right ICA just below the skullbase. Left carotid system: Negative aside from mild to moderate tortuosity and dolichoectasia of the left ICA just below the skullbase. Vertebral arteries:No proximal subclavian artery plaque or stenosis. Normal vertebral artery origins. The left vertebral is mildly dominant, and this significantly larger vessel inside the skull, see below. Left greater than right vertebral artery tortuosity, but no vertebral atherosclerosis or stenosis in the neck. CTA HEAD Posterior circulation: The distal left vertebral artery is dominant. Both distal vertebral arteries and PICA origins are patent. There is mild to moderate irregularity with a diminutive appearance of the distal vertebrals once in the subarachnoid space, likely due to subarachnoid hemorrhage related vasospasm. Patent vertebrobasilar junction. Patent basilar artery with mild to moderate probable vasospasm related  irregularity. There is distal tapering of the vessel. There is a large oval saccular aneurysm of the basilar artery tip projecting cephalad and slightly to the left encompassing 11-12 mm diameter by 13 mm CC. The ventral lateral walls of the aneurysm incorporate both of the PCA origins as seen on series 12, images 107 and 109. The SCA origins appear less affected, although the right SCA origin is in proximity to the neck of the aneurysm. The left SCA origin is not well visualized. Both posterior communicating arteries are diminutive or absent - the  right is faintly visible, but the left may be absent. Anterior circulation: Mild to moderate generalized ICA siphon dolichoectasia. Minimal calcified plaque. No ICA siphon stenosis. Ophthalmic and right posterior communicating artery origins appear normal. Patent carotid termini. Patent MCA and ACA origins. The right A1 segment is dominant. Anterior communicating artery is present. There is mild to moderate bilateral ACA more so than MCA vessel irregularity compatible with vasospasm. Bilateral MCA bifurcations are patent. No MCA or ACA branch occlusion identified. Venous sinuses: Not evaluated due to arterial contrast timing and no delayed imaging. Anatomic variants: Dominant left vertebral artery. Diminutive (right) or absent (left) posterior communicating arteries. Dominant right A1 segment. Review of the MIP images confirms the above findings IMPRESSION: 1. 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 This study was preliminarily reviewed in person with Dr. Amie Portland at 1139 hours. 2. The aneurysm sac appears to incorporate both PCA origins. The posterior communicating arteries are diminutive (right) or absent (left). The left vertebral artery is dominant. 3. Generalized intracranial vasospasm related to the subarachnoid hemorrhage. 4. Superimposed arterial dolichoectasia, especially involving the distal cervical  ICAs and ICA siphons. 5. No atherosclerosis or stenosis at the great vessel origins or in the neck. Minimal intracranial atherosclerosis. Electronically Signed   By: Genevie Ann M.D.   On: 12/03/2016 11:56   Ct Angio Neck W Or Wo Contrast  Result Date: 12/03/2016 CLINICAL DATA:  66 year old female who collapsed while on treadmill. Family history of intracranial aneurysm, and aneurysmal appearing subarachnoid hemorrhage on noncontrast head CT today. EXAM: CT ANGIOGRAPHY HEAD AND NECK TECHNIQUE: Multidetector CT imaging of the head and neck was performed using the standard protocol during bolus administration of intravenous contrast. Multiplanar CT image reconstructions and MIPs were obtained to evaluate the vascular anatomy. Carotid stenosis measurements (when applicable) are obtained utilizing NASCET criteria, using the distal internal carotid diameter as the denominator. CONTRAST:  100 mL Isovue 370 COMPARISON:  Head CT without contrast 1051 hours today. FINDINGS: CTA NECK Skeleton: Cervical spine multilevel left side facet and lower disc and endplate chronic degeneration. Mild thoracic scoliosis. No acute osseous abnormality identified. Upper chest: Negative lung apices. Negative visualized superior mediastinum. Other neck: Negative.  No cervical lymphadenopathy. Aortic arch: 3 vessel arch configuration with minimal arch atherosclerosis and no great vessel origin stenosis. Right carotid system: Negative aside from mild to moderate tortuosity and dolichoectasia of the right ICA just below the skullbase. Left carotid system: Negative aside from mild to moderate tortuosity and dolichoectasia of the left ICA just below the skullbase. Vertebral arteries:No proximal subclavian artery plaque or stenosis. Normal vertebral artery origins. The left vertebral is mildly dominant, and this significantly larger vessel inside the skull, see below. Left greater than right vertebral artery tortuosity, but no vertebral  atherosclerosis or stenosis in the neck. CTA HEAD Posterior circulation: The distal left vertebral artery is dominant. Both distal vertebral arteries and PICA origins are patent. There is mild to moderate irregularity with a diminutive appearance of the distal vertebrals once in the subarachnoid space, likely due to subarachnoid hemorrhage related vasospasm. Patent vertebrobasilar junction. Patent basilar artery with mild to moderate probable vasospasm related irregularity. There is distal tapering of the vessel. There is a large oval saccular aneurysm of the basilar artery tip projecting cephalad and slightly to the left encompassing 11-12 mm diameter by 13 mm CC. The ventral lateral walls of the aneurysm incorporate both of the PCA origins as seen on series  12, images 107 and 109. The SCA origins appear less affected, although the right SCA origin is in proximity to the neck of the aneurysm. The left SCA origin is not well visualized. Both posterior communicating arteries are diminutive or absent - the right is faintly visible, but the left may be absent. Anterior circulation: Mild to moderate generalized ICA siphon dolichoectasia. Minimal calcified plaque. No ICA siphon stenosis. Ophthalmic and right posterior communicating artery origins appear normal. Patent carotid termini. Patent MCA and ACA origins. The right A1 segment is dominant. Anterior communicating artery is present. There is mild to moderate bilateral ACA more so than MCA vessel irregularity compatible with vasospasm. Bilateral MCA bifurcations are patent. No MCA or ACA branch occlusion identified. Venous sinuses: Not evaluated due to arterial contrast timing and no delayed imaging. Anatomic variants: Dominant left vertebral artery. Diminutive (right) or absent (left) posterior communicating arteries. Dominant right A1 segment. Review of the MIP images confirms the above findings IMPRESSION: 1. Large saccular Basilar Artery tip aneurysm, 12 x 13 mm  directed cephalad and slightly to the left. This study was preliminarily reviewed in person with Dr. Amie Portland at 1139 hours. 2. The aneurysm sac appears to incorporate both PCA origins. The posterior communicating arteries are diminutive (right) or absent (left). The left vertebral artery is dominant. 3. Generalized intracranial vasospasm related to the subarachnoid hemorrhage. 4. Superimposed arterial dolichoectasia, especially involving the distal cervical ICAs and ICA siphons. 5. No atherosclerosis or stenosis at the great vessel origins or in the neck. Minimal intracranial atherosclerosis. Electronically Signed   By: Genevie Ann M.D.   On: 12/03/2016 11:56   Ct Head Code Stroke Wo Contrast  Result Date: 12/03/2016 CLINICAL DATA:  Code stroke. Patient became unresponsive after walking on treadmill. Altered level of consciousness. EXAM: CT HEAD WITHOUT CONTRAST TECHNIQUE: Contiguous axial images were obtained from the base of the skull through the vertex without intravenous contrast. COMPARISON:  None. FINDINGS: Brain: Extensive subarachnoid hemorrhage is present. There is slight asymmetry in the right sylvian fissure. Extensive hemorrhages noted in the interpeduncular notch with extension of quadrigeminal plate cistern. Hemorrhage extends into the ventricles with hyperdense blood in the third and fourth ventricles. There is prominence of the temporal tip suggesting early hydrocephalus. Extensive mass effect is present with effacement of the sulci and crowding of the posterior fossa. Subarachnoid hemorrhage extends ventral to the brainstem into the upper cervical spine. Basal ganglia are intact. No focal cortical lesions are present. There is no significant parenchymal hemorrhage. Vascular: Minimal vascular calcifications are noted within the cavernous internal carotid artery is bilaterally. Skull: Calvarium is intact. No focal lytic or blastic lesions are present. Sinuses/Orbits: The paranasal sinuses and  mastoid air cells are clear. Globes and orbits are within normal limits. IMPRESSION: 1. Diffuse subarachnoid hemorrhage with intraventricular extension, Fisher grade 4. 2. Prominence of the temporal tip suggesting early hydrocephalus. 3. Hemorrhages most prominent along the interpeduncular notch, right sylvian fissure, and right CP angle. Aneurysmal hemorrhage is suspected, CTA of the head and neck would be useful for further evaluation. These results were called by telephone at the time of interpretation on 12/03/2016 at 11:00 am to Dr. Lajean Saver , who verbally acknowledged these results. Electronically Signed   By: San Morelle M.D.   On: 12/03/2016 11:12    Review of Systems  Constitutional: Negative.   HENT: Negative.   Eyes: Negative.   Respiratory: Negative.   Cardiovascular: Negative.   Gastrointestinal: Negative.   Genitourinary: Negative.   Musculoskeletal:  Negative.   Skin: Negative.   Endo/Heme/Allergies: Negative.   Psychiatric/Behavioral: Negative.     Blood pressure (!) 111/56, pulse (!) 55, temperature (!) 95.5 F (35.3 C), temperature source Temporal, resp. rate 16, SpO2 100 %. Physical Exam  Constitutional: She is oriented to person, place, and time. She appears well-developed and well-nourished. No distress.  HENT:  Head: Normocephalic and atraumatic.  Right Ear: External ear normal.  Left Ear: External ear normal.  Mouth/Throat: Oropharynx is clear and moist.  Eyes: Pupils are equal, round, and reactive to light. Conjunctivae and EOM are normal.  Neck:  Nuchal rigidity  Cardiovascular: Normal rate, regular rhythm and normal heart sounds.   Respiratory: Effort normal and breath sounds normal.  GI: Soft. Bowel sounds are normal.  Musculoskeletal: Normal range of motion. She exhibits no edema, tenderness or deformity.  Neurological: She is alert and oriented to person, place, and time. She has normal strength and normal reflexes. She displays normal reflexes.  No cranial nerve deficit. She exhibits normal muscle tone. Coordination abnormal. GCS eye subscore is 4. GCS verbal subscore is 5. GCS motor subscore is 6. She displays no Babinski's sign on the right side. She displays no Babinski's sign on the left side.  Confused, not aware of year, how many years she has been married Easily following commands No drift   Skin: Skin is warm and dry.     Assessment/Plan Clova Morlock Ryce is a 66 y.o. female Whom earlier today while walking on a treadmill became unresponsive. 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 The plan is to place a ventricular catheter urgently, then take her to angiography to obtain a 4 vessel angiogram in anticipation of coiling the aneurysm if possible. Currently hunt hess lll, may improve with the ventricular catheter. Risks and benefits of the ventricular catheter placement were explained to the family, bleeding, infection, need for replacement, brain damage, paralysis, change in mentation, seizures, and other risks.They understand, her family, and wish to proceed.   Harriet Bollen L, MD 12/03/2016, 12:36 PM

## 2016-12-03 NOTE — Anesthesia Procedure Notes (Signed)
Procedure Name: Intubation Date/Time: 12/03/2016 5:14 PM Performed by: Manus Gunning, Rudene Poulsen J Pre-anesthesia Checklist: Emergency Drugs available, Suction available, Patient identified, Patient being monitored and Timeout performed Patient Re-evaluated:Patient Re-evaluated prior to induction Oxygen Delivery Method: Circle system utilized Preoxygenation: Pre-oxygenation with 100% oxygen Induction Type: IV induction Ventilation: Mask ventilation without difficulty Laryngoscope Size: Mac and 3 Grade View: Grade I Tube type: Subglottic suction tube Tube size: 7.5 mm Number of attempts: 1 Airway Equipment and Method: Stylet Placement Confirmation: ETT inserted through vocal cords under direct vision,  positive ETCO2 and breath sounds checked- equal and bilateral Secured at: 21 cm Tube secured with: Tape Dental Injury: Teeth and Oropharynx as per pre-operative assessment

## 2016-12-03 NOTE — ED Provider Notes (Signed)
[facility]-EMERGENCY DEPT Provider Note   CSN: [id number] Arrival date & time: 12/03/16  1037     History   Chief Complaint Chief Complaint  Patient presents with  . Code Stroke    HPI Shelby Ayala is a 66 y.o. female.  Patient presents from [facility] where she was exercising. Pt abruptly noted to take self off treadmill and lay on ground. Pt was unresponsive verbally and remains so in ED - level 5 caveat. Is moving bilateral extremities purposefully.    The history is provided by the patient. The history is limited by the condition of the patient.    History reviewed. No pertinent past medical history.  There are no active problems to display for this patient.   History reviewed. No pertinent surgical history.  OB History    No data available       Home Medications    Prior to Admission medications   Not on File    Family History No family history on file.  Social History Social History  Substance Use Topics  . Smoking status: Not on file  . Smokeless tobacco: Not on file  . Alcohol use Not on file     Allergies   Patient has no known allergies.   Review of Systems Review of Systems  Unable to perform ROS: Patient unresponsive  level 5 caveat     Physical Exam Updated Vital Signs BP (!) 178/86   Pulse 66   SpO2 94%   Physical Exam  Constitutional: She appears well-developed and well-nourished. No distress.  HENT:  Head: Atraumatic.  Mouth/Throat: Oropharynx is clear and moist.  Eyes: Pupils are equal, round, and reactive to light. Conjunctivae are normal. No scleral icterus.  Neck: Neck supple. No tracheal deviation present.  No bruits.   Cardiovascular: Normal rate, regular rhythm, normal heart sounds and intact distal pulses.  Exam reveals no gallop and no friction rub.   No murmur heard. Pulmonary/Chest: Effort normal and breath sounds normal. No respiratory distress.  Abdominal: Soft. Normal appearance and bowel sounds are normal. She  exhibits no distension. There is no tenderness.  Genitourinary:  Genitourinary Comments: No cva tenderness.   Musculoskeletal: She exhibits no edema.  Neurological:  Pt opens eyes spontaneously, moves bil ext purposefully, and will follow simple commands. No verbal response.   Skin: Skin is warm and dry. No rash noted. She is not diaphoretic.  Psychiatric: She has a normal mood and affect.  Nursing note and vitals reviewed.    ED Treatments / Results  Labs (all labs ordered are listed, but only abnormal results are displayed) Results for orders placed or performed during the hospital encounter of 12/03/16  Protime-INR  Result Value Ref Range   Prothrombin Time 12.1 11.4 - 15.2 seconds   INR 0.91   APTT  Result Value Ref Range   aPTT 25 24 - 36 seconds  CBC  Result Value Ref Range   WBC 11.5 (H) 4.0 - 10.5 K/uL   RBC 4.45 3.87 - 5.11 MIL/uL   Hemoglobin 13.7 12.0 - 15.0 g/dL   HCT 40.9 81.1 - 91.4 %   MCV 92.4 78.0 - 100.0 fL   MCH 30.8 26.0 - 34.0 pg   MCHC 33.3 30.0 - 36.0 g/dL   RDW 78.2 95.6 - 21.3 %   Platelets 210 150 - 400 K/uL  Differential  Result Value Ref Range   Neutrophils Relative % 50 %   Neutro Abs 5.7 1.7 - 7.7 K/uL  Lymphocytes Relative 41 %   Lymphs Abs 4.7 (H) 0.7 - 4.0 K/uL   Monocytes Relative 8 %   Monocytes Absolute 0.9 0.1 - 1.0 K/uL   Eosinophils Relative 1 %   Eosinophils Absolute 0.1 0.0 - 0.7 K/uL   Basophils Relative 0 %   Basophils Absolute 0.0 0.0 - 0.1 K/uL  Comprehensive metabolic panel  Result Value Ref Range   Sodium 136 135 - 145 mmol/L   Potassium 3.2 (L) 3.5 - 5.1 mmol/L   Chloride 102 101 - 111 mmol/L   CO2 19 (L) 22 - 32 mmol/L   Glucose, Bld 192 (H) 65 - 99 mg/dL   BUN 8 6 - 20 mg/dL   Creatinine, Ser 1.61 0.44 - 1.00 mg/dL   Calcium 9.2 8.9 - 09.6 mg/dL   Total Protein 6.8 6.5 - 8.1 g/dL   Albumin 4.1 3.5 - 5.0 g/dL   AST 36 15 - 41 U/L   ALT 27 14 - 54 U/L   Alkaline Phosphatase 88 38 - 126 U/L   Total Bilirubin  1.0 0.3 - 1.2 mg/dL   GFR calc non Af Amer >60 >60 mL/min   GFR calc Af Amer >60 >60 mL/min   Anion gap 15 5 - 15  I-stat troponin, ED  Result Value Ref Range   Troponin i, poc 0.00 0.00 - 0.08 ng/mL   Comment 3          I-Stat Chem 8, ED  Result Value Ref Range   Sodium 137 135 - 145 mmol/L   Potassium 3.1 (L) 3.5 - 5.1 mmol/L   Chloride 103 101 - 111 mmol/L   BUN 11 6 - 20 mg/dL   Creatinine, Ser 0.45 0.44 - 1.00 mg/dL   Glucose, Bld 409 (H) 65 - 99 mg/dL   Calcium, Ion 8.11 (L) 1.15 - 1.40 mmol/L   TCO2 22 22 - 32 mmol/L   Hemoglobin 14.3 12.0 - 15.0 g/dL   HCT 91.4 78.2 - 95.6 %  I-Stat CG4 Lactic Acid, ED  Result Value Ref Range   Lactic Acid, Venous 2.85 (HH) 0.5 - 1.9 mmol/L   Comment NOTIFIED PHYSICIAN     EKG  EKG Interpretation None       Radiology Ct Angio Head W Or Wo Contrast  Result Date: 12/03/2016 CLINICAL DATA:  66 year old female who collapsed while on treadmill. Family history of intracranial aneurysm, and aneurysmal appearing subarachnoid hemorrhage on noncontrast head CT today. EXAM: CT ANGIOGRAPHY HEAD AND NECK TECHNIQUE: Multidetector CT imaging of the head and neck was performed using the standard protocol during bolus administration of intravenous contrast. Multiplanar CT image reconstructions and MIPs were obtained to evaluate the vascular anatomy. Carotid stenosis measurements (when applicable) are obtained utilizing NASCET criteria, using the distal internal carotid diameter as the denominator. CONTRAST:  100 mL Isovue 370 COMPARISON:  Head CT without contrast 1051 hours today. FINDINGS: CTA NECK Skeleton: Cervical spine multilevel left side facet and lower disc and endplate chronic degeneration. Mild thoracic scoliosis. No acute osseous abnormality identified. Upper chest: Negative lung apices. Negative visualized superior mediastinum. Other neck: Negative.  No cervical lymphadenopathy. Aortic arch: 3 vessel arch configuration with minimal arch  atherosclerosis and no great vessel origin stenosis. Right carotid system: Negative aside from mild to moderate tortuosity and dolichoectasia of the right ICA just below the skullbase. Left carotid system: Negative aside from mild to moderate tortuosity and dolichoectasia of the left ICA just below the skullbase. Vertebral arteries:No proximal subclavian artery plaque  or stenosis. Normal vertebral artery origins. The left vertebral is mildly dominant, and this significantly larger vessel inside the skull, see below. Left greater than right vertebral artery tortuosity, but no vertebral atherosclerosis or stenosis in the neck. CTA HEAD Posterior circulation: The distal left vertebral artery is dominant. Both distal vertebral arteries and PICA origins are patent. There is mild to moderate irregularity with a diminutive appearance of the distal vertebrals once in the subarachnoid space, likely due to subarachnoid hemorrhage related vasospasm. Patent vertebrobasilar junction. Patent basilar artery with mild to moderate probable vasospasm related irregularity. There is distal tapering of the vessel. There is a large oval saccular aneurysm of the basilar artery tip projecting cephalad and slightly to the left encompassing 11-12 mm diameter by 13 mm CC. The ventral lateral walls of the aneurysm incorporate both of the PCA origins as seen on series 12, images 107 and 109. The SCA origins appear less affected, although the right SCA origin is in proximity to the neck of the aneurysm. The left SCA origin is not well visualized. Both posterior communicating arteries are diminutive or absent - the right is faintly visible, but the left may be absent. Anterior circulation: Mild to moderate generalized ICA siphon dolichoectasia. Minimal calcified plaque. No ICA siphon stenosis. Ophthalmic and right posterior communicating artery origins appear normal. Patent carotid termini. Patent MCA and ACA origins. The right A1 segment is  dominant. Anterior communicating artery is present. There is mild to moderate bilateral ACA more so than MCA vessel irregularity compatible with vasospasm. Bilateral MCA bifurcations are patent. No MCA or ACA branch occlusion identified. Venous sinuses: Not evaluated due to arterial contrast timing and no delayed imaging. Anatomic variants: Dominant left vertebral artery. Diminutive (right) or absent (left) posterior communicating arteries. Dominant right A1 segment. Review of the MIP images confirms the above findings IMPRESSION: 1. 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 This study was preliminarily reviewed in person with Dr. Milon DikesASHISH ARORA at 1139 hours. 2. The aneurysm sac appears to incorporate both PCA origins. The posterior communicating arteries are diminutive (right) or absent (left). The left vertebral artery is dominant. 3. Generalized intracranial vasospasm related to the subarachnoid hemorrhage. 4. Superimposed arterial dolichoectasia, especially involving the distal cervical ICAs and ICA siphons. 5. No atherosclerosis or stenosis at the great vessel origins or in the neck. Minimal intracranial atherosclerosis. Electronically Signed   By: Odessa FlemingH  Hall M.D.   On: 12/03/2016 11:56   Ct Angio Neck W Or Wo Contrast  Result Date: 12/03/2016 CLINICAL DATA:  66 year old female who collapsed while on treadmill. Family history of intracranial aneurysm, and aneurysmal appearing subarachnoid hemorrhage on noncontrast head CT today. EXAM: CT ANGIOGRAPHY HEAD AND NECK TECHNIQUE: Multidetector CT imaging of the head and neck was performed using the standard protocol during bolus administration of intravenous contrast. Multiplanar CT image reconstructions and MIPs were obtained to evaluate the vascular anatomy. Carotid stenosis measurements (when applicable) are obtained utilizing NASCET criteria, using the distal internal carotid diameter as the denominator.  CONTRAST:  100 mL Isovue 370 COMPARISON:  Head CT without contrast 1051 hours today. FINDINGS: CTA NECK Skeleton: Cervical spine multilevel left side facet and lower disc and endplate chronic degeneration. Mild thoracic scoliosis. No acute osseous abnormality identified. Upper chest: Negative lung apices. Negative visualized superior mediastinum. Other neck: Negative.  No cervical lymphadenopathy. Aortic arch: 3 vessel arch configuration with minimal arch atherosclerosis and no great vessel origin stenosis. Right carotid system:  Negative aside from mild to moderate tortuosity and dolichoectasia of the right ICA just below the skullbase. Left carotid system: Negative aside from mild to moderate tortuosity and dolichoectasia of the left ICA just below the skullbase. Vertebral arteries:No proximal subclavian artery plaque or stenosis. Normal vertebral artery origins. The left vertebral is mildly dominant, and this significantly larger vessel inside the skull, see below. Left greater than right vertebral artery tortuosity, but no vertebral atherosclerosis or stenosis in the neck. CTA HEAD Posterior circulation: The distal left vertebral artery is dominant. Both distal vertebral arteries and PICA origins are patent. There is mild to moderate irregularity with a diminutive appearance of the distal vertebrals once in the subarachnoid space, likely due to subarachnoid hemorrhage related vasospasm. Patent vertebrobasilar junction. Patent basilar artery with mild to moderate probable vasospasm related irregularity. There is distal tapering of the vessel. There is a large oval saccular aneurysm of the basilar artery tip projecting cephalad and slightly to the left encompassing 11-12 mm diameter by 13 mm CC. The ventral lateral walls of the aneurysm incorporate both of the PCA origins as seen on series 12, images 107 and 109. The SCA origins appear less affected, although the right SCA origin is in proximity to the neck of the  aneurysm. The left SCA origin is not well visualized. Both posterior communicating arteries are diminutive or absent - the right is faintly visible, but the left may be absent. Anterior circulation: Mild to moderate generalized ICA siphon dolichoectasia. Minimal calcified plaque. No ICA siphon stenosis. Ophthalmic and right posterior communicating artery origins appear normal. Patent carotid termini. Patent MCA and ACA origins. The right A1 segment is dominant. Anterior communicating artery is present. There is mild to moderate bilateral ACA more so than MCA vessel irregularity compatible with vasospasm. Bilateral MCA bifurcations are patent. No MCA or ACA branch occlusion identified. Venous sinuses: Not evaluated due to arterial contrast timing and no delayed imaging. Anatomic variants: Dominant left vertebral artery. Diminutive (right) or absent (left) posterior communicating arteries. Dominant right A1 segment. Review of the MIP images confirms the above findings IMPRESSION: 1. 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 This study was preliminarily reviewed in person with Dr. Milon Dikes at 1139 hours. 2. The aneurysm sac appears to incorporate both PCA origins. The posterior communicating arteries are diminutive (right) or absent (left). The left vertebral artery is dominant. 3. Generalized intracranial vasospasm related to the subarachnoid hemorrhage. 4. Superimposed arterial dolichoectasia, especially involving the distal cervical ICAs and ICA siphons. 5. No atherosclerosis or stenosis at the great vessel origins or in the neck. Minimal intracranial atherosclerosis. Electronically Signed   By: Odessa Fleming M.D.   On: 12/03/2016 11:56   Ct Head Code Stroke Wo Contrast  Result Date: 12/03/2016 CLINICAL DATA:  Code stroke. Patient became unresponsive after walking on treadmill. Altered level of consciousness. EXAM: CT HEAD WITHOUT CONTRAST TECHNIQUE:  Contiguous axial images were obtained from the base of the skull through the vertex without intravenous contrast. COMPARISON:  None. FINDINGS: Brain: Extensive subarachnoid hemorrhage is present. There is slight asymmetry in the right sylvian fissure. Extensive hemorrhages noted in the interpeduncular notch with extension of quadrigeminal plate cistern. Hemorrhage extends into the ventricles with hyperdense blood in the third and fourth ventricles. There is prominence of the temporal tip suggesting early hydrocephalus. Extensive mass effect is present with effacement of the sulci and crowding of the posterior fossa. Subarachnoid hemorrhage extends ventral to the  brainstem into the upper cervical spine. Basal ganglia are intact. No focal cortical lesions are present. There is no significant parenchymal hemorrhage. Vascular: Minimal vascular calcifications are noted within the cavernous internal carotid artery is bilaterally. Skull: Calvarium is intact. No focal lytic or blastic lesions are present. Sinuses/Orbits: The paranasal sinuses and mastoid air cells are clear. Globes and orbits are within normal limits. IMPRESSION: 1. Diffuse subarachnoid hemorrhage with intraventricular extension, Fisher grade 4. 2. Prominence of the temporal tip suggesting early hydrocephalus. 3. Hemorrhages most prominent along the interpeduncular notch, right sylvian fissure, and right CP angle. Aneurysmal hemorrhage is suspected, CTA of the head and neck would be useful for further evaluation. These results were called by telephone at the time of interpretation on 12/03/2016 at 11:00 am to Dr. Cathren Laine , who verbally acknowledged these results. Electronically Signed   By: Marin Roberts M.D.   On: 12/03/2016 11:12    Procedures Procedures (including critical care time)  Medications Ordered in ED Medications  ondansetron (ZOFRAN) 4 MG/2ML injection (4 mg Intravenous Given 12/03/16 1055)     Initial Impression /  Assessment and Plan / ED Course  I have reviewed the triage vital signs and the nursing notes.  Pertinent labs & imaging results that were available during my care of the patient were reviewed by me and considered in my medical decision making (see chart for details).  Iv ns. Continuous pulse ox and monitor.   o2 [place].   Stat ct.   Neurology evaluated in ED.  CT w large [facility] - neurosurgery paged. cardene gtt started as bp elevated.   Reviewed nursing notes and prior charts for additional history.   Spouse gives additional hx - pts parent with brain aneurysm.   cardene gtt titrated with excellent bp control.  Recheck pt, pt now speaking short sentences, does c/o headache/persistent.   Repeat neuro exam, moves bil ext, follows commands.   Dilaudid .5 mg iv. zofran iv.   Neurosurgery admitted and going to angio lab for angiogram and coiling procedure.   CRITICAL CARE  Acute, large SAH, basilar tip aneurysm, acute altered mental status, hypertension.  Performed by: Suzi Roots Total critical care time: 40 minutes Critical care time was exclusive of separately billable procedures and treating other patients. Critical care was necessary to treat or prevent imminent or life-threatening deterioration. Critical care was time spent personally by me on the following activities: development of treatment plan with patient and/or surrogate as well as nursing, discussions with consultants, evaluation of patient's response to treatment, examination of patient, obtaining history from patient or surrogate, ordering and performing treatments and interventions, ordering and review of laboratory studies, ordering and review of radiographic studies, pulse oximetry and re-evaluation of patient's condition.   Final Clinical Impressions(s) / ED Diagnoses   Final diagnoses:  None    New Prescriptions New Prescriptions   No medications on file     Cathren Laine, MD 12/03/16 1207

## 2016-12-03 NOTE — ED Notes (Signed)
Returned from CT Patient is more alert c/o increased headache. Dr =Cabell at bedside.

## 2016-12-03 NOTE — ED Notes (Signed)
Awaiting to go to CT for CT angiogram

## 2016-12-03 NOTE — Consult Note (Signed)
Code stroke  Neurology Consultation  Reason for Consult: code stroke, unresponsive Referring Physician: Dr Denton Lank  CC: unresponsive  History is obtained from: EMS  HPI: Shelby Ayala is a 66 y.o. female  With no known past medical history who was brought in by EMS when she became suddenly unresponsive in the gym while she was on a treadmilatient is not able to provide  History. She was taken in for stat noncontrast,ct head  that revealed a diffuse subarachnoid hemorrhage involving the basal cisterns as well as some IVH with early hydrocephalus. Her blood pressures on arrival for over 170 systolic. Cardene was used to control the blood pressure below 140. She was then taken for CT angiogram that revealed ectatic carotids and a 1.1 cm x1.1 cm basilar aneurysm, and diffuse mile vasospasm.  LKW: 1000 hrs tpa given?: no, SAH Premorbid mRS - 0  ROS: Unable to obtain due to altered mental status.   History reviewed. No pertinent past medical history.  No family history on file. Father died of an aneurysm at the age of 25  Social History:   has no tobacco, alcohol, and drug history on file.  Medications  Current Facility-Administered Medications:  .  iopamidol (ISOVUE-370) 76 % injection, , , ,  No current outpatient prescriptions on file.  Exam: Current vital signs: BP (!) 141/69 (BP Location: Left Arm)   Pulse 61   Temp (!) 95.5 F (35.3 C) (Temporal)   Resp 18   SpO2 98%  Vital signs in last 24 hours: Temp:  [95.5 F (35.3 C)] 95.5 F (35.3 C) (09/05 1101) Pulse Rate:  [48-66] 61 (09/05 1106) Resp:  [18] 18 (09/05 1101) BP: (141-178)/(69-91) 141/69 (09/05 1106) SpO2:  [94 %-98 %] 98 % (09/05 1106)  GENERAL: drowsy, opens eyes to loud voice intermittently. Does not follow commands initially,intermittently followed commands thereafter. HEENT: - Normocephalic and atraumatic, dry mm, no LN++, no Thyromegally LUNGS - Clear to auscultation bilaterally with no wheezes CV  - S1S2 RRR, no m/r/g, equal pulses bilaterally. ABDOMEN - Soft, nontender, nondistended with normoactive BS Ext: warm, well perfused, intact peripheral pulses, no edema NEURO:  Mental Status: drowsy, opens eyes to loud voice intermittently, initially did not follow commands, following commands intermittently. Complained of progressivelyworsening headache. Language: speech is not dysarthri.  Unable to test name and repetition Cranial Nerves: PERRL85mm/brisk. EOMI, visual fields full, no facial asymmetry, facial sensation intact, hearing intact, tongue/uvula/soft palate midline, normal sternocleidomastoid and trapezius muscle strength. No evidence of tongue atrophy or fibrillations Motor: grossly antigravity in all 4 extremities. Tone: is normal and bulk is normal Sensation- Intact to light touch bilaterally Coordination:unable to test Gait- deferred  NIHSS 1a Level of Conscious.: 1 1b LOC Questions: 2 1c LOC Commands: 1 2 Best Gaze: 0 3 Visual: 0 4 Facial Palsy: 0 5a Motor Arm - left: 1 5b Motor Arm - Right: 1 6a Motor Leg - Left: 2 6b Motor Leg - Right: 2 7 Limb Ataxia: 0 8 Sensory: 0 9 Best Language: 0 10 Dysarthria: 0 11 Extinct. and Inatten.: 0 TOTAL: 10   Labs I have reviewed labs in epic and the results pertinent to this consultation are:  CBC    Component Value Date/Time   WBC 11.5 (H) 12/03/2016 1047   RBC 4.45 12/03/2016 1047   HGB 14.3 12/03/2016 1055   HCT 42.0 12/03/2016 1055   PLT 210 12/03/2016 1047   MCV 92.4 12/03/2016 1047   MCH 30.8 12/03/2016 1047   MCHC 33.3  12/03/2016 1047   RDW 12.8 12/03/2016 1047   LYMPHSABS 4.7 (H) 12/03/2016 1047   MONOABS 0.9 12/03/2016 1047   EOSABS 0.1 12/03/2016 1047   BASOSABS 0.0 12/03/2016 1047    CMP     Component Value Date/Time   NA 137 12/03/2016 1055   K 3.1 (L) 12/03/2016 1055   CL 103 12/03/2016 1055   CO2 19 (L) 12/03/2016 1047   GLUCOSE 191 (H) 12/03/2016 1055   BUN 11 12/03/2016 1055   CREATININE  0.50 12/03/2016 1055   CALCIUM 9.2 12/03/2016 1047   PROT 6.8 12/03/2016 1047   ALBUMIN 4.1 12/03/2016 1047   AST 36 12/03/2016 1047   ALT 27 12/03/2016 1047   ALKPHOS 88 12/03/2016 1047   BILITOT 1.0 12/03/2016 1047   GFRNONAA >60 12/03/2016 1047   GFRAA >60 12/03/2016 1047   Imaging I have reviewed the images obtained: CT-scan of the brain shows extensive subarachnoid hemorrhage, possible basilar tip aneurysm. CTA of the head and neck shows a 1.1 cm 1.1 cm basilar aneurysm with ectatic caroids as well  Is diffuse vasospasm  Assessment:  66 year old woman with no known past medical history presents for evaluation of sudden onset of unresponsiveness while she was exercising in the gym on a treadmill. Her exam was nonfocal with the exception of presence of drowsiness. noncontrast CT of the head shows extensive subarachnoid hemorrhage possibly secondary to the basilar tip aneurysm.  Impression: Subarachnoid hemorrhage-aneurysmal- HH3/Fisher 4. Basilar tip aneurysm  Recommendations: Blood pressure goal systolic less than 140 mmHg No antiplatelets or anticoagulants at this time. Urgent neurosurgical consult for possible coiling ( endovascular) versus open clipping. Further management per emergency room in neurosurgery. Please call us as needed.  -- Milon DikesAshish Aleshia Cartelli, MD [facility] [phone]  If 7pm to 7am, please call on call as listed on AMION.  Critical Care Attestation This patient is critically ill and at significant risk of neurological worsening, death and care requires constant monitoring of vital signs, hemodynamics,respiratory and cardiac monitoring, neurological assessment, discussion with family, other specialists and medical decision making of high complexity. I spent 45  minutes of neurocritical care time  in the care of  this patient.

## 2016-12-03 NOTE — Progress Notes (Signed)
Pt transport on bed with monitor, RN and CRNA to [id number].  Report given to Luan Pullingich, RN all questions answered. R groin and pulses assessed together at bedside, see flowsheet. Dr. Conchita ParisNundkumar at bedside with family discussing results of procedure and plan of care.

## 2016-12-04 ENCOUNTER — Encounter (HOSPITAL_COMMUNITY): Payer: Self-pay

## 2016-12-04 ENCOUNTER — Inpatient Hospital Stay (HOSPITAL_COMMUNITY): Payer: Medicare Other

## 2016-12-04 DIAGNOSIS — I609 Nontraumatic subarachnoid hemorrhage, unspecified: Secondary | ICD-10-CM

## 2016-12-04 MED ORDER — SODIUM CHLORIDE 0.9 % IV SOLN
500.0000 mg | Freq: Two times a day (BID) | INTRAVENOUS | Status: DC
  Administered 2016-12-04: 500 mg via INTRAVENOUS
  Filled 2016-12-04: qty 5

## 2016-12-04 MED ORDER — SODIUM CHLORIDE 0.9 % IV SOLN
1000.0000 mg | Freq: Two times a day (BID) | INTRAVENOUS | Status: DC
  Administered 2016-12-05 – 2016-12-18 (×27): 1000 mg via INTRAVENOUS
  Filled 2016-12-04 (×28): qty 10

## 2016-12-04 NOTE — Progress Notes (Signed)
Transcranial Doppler  Date POD PCO2 HCT BP  MCA ACA PCA OPHT SIPH VERT Basilar  9/6/rs     Right  Left   45  47   -38  - 42  31   -18  -23   -35           Right  Left                                            Right  Left                                             Right  Left                                             Right  Left                                            Right  Left                                            Right  Left                                        MCA = Middle Cerebral Artery      OPHT = Opthalmic Artery     BASILAR = Basilar Artery   ACA = Anterior Cerebral Artery     SIPH = Carotid Siphon PCA = Posterior Cerebral Artery   VERT = Verterbral Artery                   Normal MCA = 62+\-12 ACA = 50+\-12 PCA = 42+\-23

## 2016-12-04 NOTE — Progress Notes (Signed)
Patient ID: Shelby Ayala, female   DOB: 04/08/1950, 66 y.o.   MRN: [id number] BP 121/61   Pulse (!) 48   Temp 99.1 F (37.3 C) (Axillary)   Resp 20   Ht 5\' 7"  (1.702 m)   Wt 62.8 kg (138 lb 7.2 oz)   SpO2 100%   BMI 21.68 kg/m  Lethargic, will not open eyes to noxious stimuli. Purposeful spontaneous movements. Pupils are large 5mm, minimally reactive to light. +corneals Agree with plans for mri tomorrow if no change. Agree with midbrain dysfunction.

## 2016-12-04 NOTE — Plan of Care (Signed)
Problem: Education: Goal: Knowledge of disease or condition will improve Outcome: Not Progressing Pt is not responsive to questions currently.  Patient does follow commands with movement of hands and feet.  Melford AaseSusan J Ravindra Baranek RN

## 2016-12-04 NOTE — Progress Notes (Signed)
Upon assessing pt RN noticed acute neuro status change in pt. Decreased level of responsiveness and new inability to follow commands. RN called attending physician to make aware. MD ordered a STAT head CT. Will continue to monitor. Delfino Lovettichie Stanley Lyness, RN, BSN 12/04/2016 5:06 AM

## 2016-12-04 NOTE — Progress Notes (Signed)
Pt seen and examined. Initially postop last night pt was drowsy but arousable, talking, following commands. Early this am she was noted to be acutely different with lethargy and minimal movements. CT done.  EXAM: Temp:  [96.2 F (35.7 C)-99 F (37.2 C)] 99 F (37.2 C) (09/06 1200) Pulse Rate:  [47-57] 48 (09/05 1600) Resp:  [9-23] 20 (09/06 1300) BP: (92-135)/(53-73) 126/64 (09/06 1300) SpO2:  [98 %-100 %] 100 % (09/06 1300) Arterial Line BP: (151-175)/(61-77) 162/67 (09/06 1200) Intake/Output      09/05 0701 - 09/06 0700 09/06 0701 - 09/07 0700   I.V. (mL/kg) 2114.6 (33.7) 875 (13.9)   Total Intake(mL/kg) 2114.6 (33.7) 875 (13.9)   Urine (mL/kg/hr) 1900    Drains 34 0   Blood 10    Total Output 1944 0   Net +170.6 +875         Eyes closed, no opening to voice Pupils 5mm, minimally reactive to light Not speaking Does follow commands BUE/BLE, appears somewhat more brisk on right  LABS: Lab Results  Component Value Date   CREATININE 0.50 12/03/2016   BUN 11 12/03/2016   NA 137 12/03/2016   K 3.1 (L) 12/03/2016   CL 103 12/03/2016   CO2 19 (L) 12/03/2016   Lab Results  Component Value Date   WBC 17.4 (H) 12/03/2016   HGB 14.8 12/03/2016   HCT 44.8 12/03/2016   MCV 94.1 12/03/2016   PLT 138 (L) 12/03/2016    IMAGING: CTH reviewed without new hemorrhage, HCP. No CT findings concerning for stroke.  TCD reviewed demonstrating normal velocities in bilateral ICA, Vert, BA, PCAs  IMPRESSION: - 66 y.o. 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 Symptoms suggest rostral midbrain dysfunction. I think SZ is possible but unlikely.  PLAN: - Will start Keppra 1000mg  for prophylaxis - If no change, will likely order MRI brain w/o Gad

## 2016-12-04 NOTE — Progress Notes (Signed)
SLP Cancellation Note  Patient Details Name: Shelby Ayala MRN: [id number] DOB: 03/23/1951   Cancelled treatment:       Reason Eval/Treat Not Completed: Patient's level of consciousness. Received orders for cognitive linguistic eval. Pt not appropriately responsive for assessment. Will follow for readiness.   Harlon DittyBonnie Moncerrat Burnstein, [place] CCC-SLP [phone]  Claudine MoutonDeBlois, [name] 12/04/2016, 1:16 PM

## 2016-12-04 NOTE — Progress Notes (Signed)
Have had no output in patient's ventricular drain since 0700 today.  Paged and spoke to Dr Franky Machoabbell, no further orders at this time.  Will continue to monitor the patient.  Melford AaseSusan J Amos Micheals RN, BSN

## 2016-12-04 NOTE — Anesthesia Postprocedure Evaluation (Signed)
Anesthesia Post Note  Patient: Shelby Ayala  Procedure(s) Performed: Procedure(s) (LRB): RADIOLOGY WITH ANESTHESIA (N/A)     Patient location during evaluation: NICU Anesthesia Type: General Level of consciousness: awake and patient cooperative Pain management: pain level controlled Vital Signs Assessment: post-procedure vital signs reviewed and stable Respiratory status: spontaneous breathing, nonlabored ventilation, respiratory function stable and patient connected to nasal cannula oxygen Cardiovascular status: blood pressure returned to baseline and stable Postop Assessment: no signs of nausea or vomiting Anesthetic complications: no    Last Vitals:  Vitals:   12/04/16 0700 12/04/16 0800  BP: 119/60 120/61  Pulse:    Resp: 19 19  Temp:  36.9 C  SpO2: 100% 100%    Last Pain:  Vitals:   12/04/16 0800  TempSrc: Oral                 Cheria Sadiq

## 2016-12-05 ENCOUNTER — Inpatient Hospital Stay (HOSPITAL_COMMUNITY): Payer: Medicare Other

## 2016-12-05 DIAGNOSIS — I609 Nontraumatic subarachnoid hemorrhage, unspecified: Secondary | ICD-10-CM

## 2016-12-05 DIAGNOSIS — M7989 Other specified soft tissue disorders: Secondary | ICD-10-CM

## 2016-12-05 LAB — GLUCOSE, CAPILLARY
GLUCOSE-CAPILLARY: 145 mg/dL — AB (ref 65–99)
Glucose-Capillary: 122 mg/dL — ABNORMAL HIGH (ref 65–99)

## 2016-12-05 MED ORDER — CYCLOSPORINE 0.05 % OP EMUL
1.0000 [drp] | Freq: Two times a day (BID) | OPHTHALMIC | Status: DC
  Administered 2016-12-05 – 2016-12-17 (×26): 1 [drp] via OPHTHALMIC
  Filled 2016-12-05 (×27): qty 1

## 2016-12-05 MED ORDER — ORAL CARE MOUTH RINSE
15.0000 mL | Freq: Two times a day (BID) | OROMUCOSAL | Status: DC
  Administered 2016-12-05 – 2016-12-06 (×3): 15 mL via OROMUCOSAL

## 2016-12-05 MED ORDER — LABETALOL HCL 20 MG/4ML IV SOSY
10.0000 mg | PREFILLED_SYRINGE | INTRAVENOUS | Status: DC | PRN
  Administered 2016-12-05: 40 mg via INTRAVENOUS
  Filled 2016-12-05 (×3): qty 8

## 2016-12-05 MED ORDER — PRO-STAT SUGAR FREE PO LIQD
30.0000 mL | Freq: Every day | ORAL | Status: DC
  Administered 2016-12-05 – 2016-12-17 (×13): 30 mL
  Filled 2016-12-05 (×13): qty 30

## 2016-12-05 MED ORDER — PRO-STAT SUGAR FREE PO LIQD: 30.0000 mL | Freq: Two times a day (BID) | ORAL | Status: DC

## 2016-12-05 MED ORDER — LABETALOL HCL 5 MG/ML IV SOLN
INTRAVENOUS | Status: AC
  Filled 2016-12-05: qty 4

## 2016-12-05 MED ORDER — VITAL AF 1.2 CAL PO LIQD
1000.0000 mL | ORAL | Status: DC
  Administered 2016-12-05 – 2016-12-17 (×12): 1000 mL
  Filled 2016-12-05 (×3): qty 1000

## 2016-12-05 MED ORDER — LABETALOL HCL 5 MG/ML IV SOLN
10.0000 mg | INTRAVENOUS | Status: DC | PRN
  Administered 2016-12-07 – 2016-12-08 (×7): 20 mg via INTRAVENOUS
  Filled 2016-12-05 (×2): qty 4
  Filled 2016-12-05: qty 8
  Filled 2016-12-05 (×6): qty 4

## 2016-12-05 MED ORDER — VITAL HIGH PROTEIN PO LIQD: 1000.0000 mL | ORAL | Status: DC

## 2016-12-05 MED ORDER — LABETALOL HCL 5 MG/ML IV SOLN
10.0000 mg | INTRAVENOUS | Status: DC | PRN
  Administered 2016-12-05 (×2): 20 mg via INTRAVENOUS

## 2016-12-05 NOTE — Plan of Care (Signed)
Problem: Nutrition: Goal: Risk of aspiration will decrease Outcome: Not Progressing Patient currently NPO and has NG for medication needs.  Once patient is more alert a bedside swallow evaluation will be completed.  Heloise PurpuraSusan Natacia Chaisson RN

## 2016-12-05 NOTE — Progress Notes (Signed)
Preliminary results by tech - Left Upper Ext. Venous Duplex Completed. Negative for deep and superficial vein thrombosis. Corri Delapaz, BS, RDMS, RVT  

## 2016-12-05 NOTE — Progress Notes (Signed)
Transcranial Doppler  Date POD PCO2 HCT BP  MCA ACA PCA OPHT SIPH VERT Basilar  9/6/rs     Right  Left   45  47   -38  -30   18  25   18  18    42  31   -18  -23   -35      9/7,rds     Right  Left   45  43   -49  -25   27  27   23  17    38  44   -21  -20   *           Right  Left                                             Right  Left                                             Right  Left                                            Right  Left                                            Right  Left                                        MCA = Middle Cerebral Artery      OPHT = Opthalmic Artery     BASILAR = Basilar Artery   ACA = Anterior Cerebral Artery     SIPH = Carotid Siphon PCA = Posterior Cerebral Artery   VERT = Verterbral Artery                   Normal MCA = 62+\-12 ACA = 50+\-12 PCA = 42+\-23   9/7 - (*) not insonated. rds

## 2016-12-05 NOTE — Progress Notes (Signed)
No issues overnight. Family report some improvement yesterday evening and today.  EXAM:  BP (!) 147/81   Pulse 63   Temp 99.5 F (37.5 C) (Axillary)   Resp 18   Ht 5\' 7"  (1.702 m)   Wt 62.8 kg (138 lb 7.2 oz)   SpO2 99%   BMI 21.68 kg/m   Arousable No eye opening to stimulus Pupils 4mm, minimally reactive (-) Doll's eyes  Able to say name, place  Follows commands all extremities EVD in place, patent @ 14, blood tinged CSF  TCD reviewed with normal flow velocities in all major vessels  IMPRESSION:  66 y.o. female SAH d# 3 s/p coiling large basilar artery. Has ocular palsies and somewhat depressed level of consciousness but relatively preserved motor function suggesting rostral midbrain dysfunction.  PLAN: - Cont supportive care - MRI today

## 2016-12-05 NOTE — Progress Notes (Signed)
Initial Nutrition Assessment  DOCUMENTATION CODES:   Not applicable  INTERVENTION:    Initiate Vital AF 1.2 at goal rate of 50 ml/h (1200 ml per day) with 30 ml Prostat daily   TF regimen to provide 1540 kcals, 105 gm protein, 973 ml of free water daily  NUTRITION DIAGNOSIS:   Inadequate oral intake related to inability to eat as evidenced by NPO status  GOAL:   Patient will meet greater than or equal to 90% of their needs  MONITOR:   Vent status, TF tolerance, Labs, Weight trends, Skin, I & O's  REASON FOR ASSESSMENT:   Consult Enteral/tube feeding initiation and management  ASSESSMENT:   66 y.o. Female; while working out on a treadmill stepped off the treadmill and was unresponsive. Head CT showed large amount of subarachnoid blood.  Patient is currently intubated on ventilator support MV: 10.3 L/min Temp (24hrs), Avg:99.2 F (37.3 C), Min:98.1 F (36.7 C), Max:99.7 F (37.6 C)  RD spoke with pt's daughter at bedside. Reports no nutrition problems PTA.  Weight stable. Labs and medications reviewed. Large bore NGT in place. CBG's 192-191.  Unable to complete Nutrition-Focused physical exam at this time.   Diet Order:  Diet NPO time specified  Skin:  Wound (see comment) (groin puncture )  Last BM:  PTA  Height:   Ht Readings from Last 1 Encounters:  12/03/16 5\' 7"  (1.702 m)   Weight:   Wt Readings from Last 1 Encounters:  12/03/16 138 lb 7.2 oz (62.8 kg)  12/05/16          146 kg (66.3 kg) bed scale  Ideal Body Weight:  61.3 kg   BMI:  Body mass index is 21.68 kg/m.  Estimated Nutritional Needs:   Kcal:  1578  Protein:  100-115 gm  Fluid:  per MD  EDUCATION NEEDS:   No education needs identified at this time  Maureen ChattersKatie Sherod Cisse, RD, LDN Pager #: [phone] After-Hours Pager #: [phone]

## 2016-12-06 ENCOUNTER — Inpatient Hospital Stay (HOSPITAL_COMMUNITY): Payer: Medicare Other

## 2016-12-06 LAB — CBC WITH DIFFERENTIAL/PLATELET
BASOS PCT: 0 %
Basophils Absolute: 0 10*3/uL (ref 0.0–0.1)
EOS ABS: 0 10*3/uL (ref 0.0–0.7)
EOS PCT: 0 %
HCT: 37.6 % (ref 36.0–46.0)
HEMOGLOBIN: 12.8 g/dL (ref 12.0–15.0)
Lymphocytes Relative: 11 %
Lymphs Abs: 1.2 10*3/uL (ref 0.7–4.0)
MCH: 31.3 pg (ref 26.0–34.0)
MCHC: 34 g/dL (ref 30.0–36.0)
MCV: 91.9 fL (ref 78.0–100.0)
Monocytes Absolute: 1 10*3/uL (ref 0.1–1.0)
Monocytes Relative: 9 %
NEUTROS PCT: 80 %
Neutro Abs: 9.1 10*3/uL — ABNORMAL HIGH (ref 1.7–7.7)
PLATELETS: 145 10*3/uL — AB (ref 150–400)
RBC: 4.09 MIL/uL (ref 3.87–5.11)
RDW: 13.1 % (ref 11.5–15.5)
WBC: 11.3 10*3/uL — AB (ref 4.0–10.5)

## 2016-12-06 LAB — BASIC METABOLIC PANEL
Anion gap: 9 (ref 5–15)
BUN: 10 mg/dL (ref 6–20)
CHLORIDE: 104 mmol/L (ref 101–111)
CO2: 24 mmol/L (ref 22–32)
Calcium: 8.5 mg/dL — ABNORMAL LOW (ref 8.9–10.3)
Creatinine, Ser: 0.46 mg/dL (ref 0.44–1.00)
GFR calc non Af Amer: >60 mL/min (ref >60)
Glucose, Bld: 142 mg/dL — ABNORMAL HIGH (ref 65–99)
Potassium: 2.8 mmol/L — ABNORMAL LOW (ref 3.5–5.1)
SODIUM: 137 mmol/L (ref 135–145)

## 2016-12-06 LAB — GLUCOSE, CAPILLARY
GLUCOSE-CAPILLARY: 118 mg/dL — AB (ref 65–99)
GLUCOSE-CAPILLARY: 146 mg/dL — AB (ref 65–99)
GLUCOSE-CAPILLARY: 145 mg/dL — AB (ref 65–99)
Glucose-Capillary: 130 mg/dL — ABNORMAL HIGH (ref 65–99)
Glucose-Capillary: 122 mg/dL — ABNORMAL HIGH (ref 65–99)

## 2016-12-06 MED ORDER — DOCUSATE SODIUM 50 MG/5ML PO LIQD
100.0000 mg | Freq: Two times a day (BID) | ORAL | Status: DC
  Administered 2016-12-06: 100 mg
  Filled 2016-12-06: qty 10

## 2016-12-06 MED ORDER — POTASSIUM CHLORIDE 20 MEQ/15ML (10%) PO SOLN
20.0000 meq | Freq: Once | ORAL | Status: AC
  Administered 2016-12-06: 20 meq
  Filled 2016-12-06: qty 15

## 2016-12-06 NOTE — Evaluation (Signed)
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  Clinical Impression  Orders received for PT evaluation. Patient demonstrates deficits in functional mobility as indicated below. Will benefit from continued skilled PT to address deficits and maximize function. Will see as indicated and progress as tolerated.  Limited to bed level ROM per MD request, will follow for ROM and further assessment of needs.    Follow Up Recommendations CIR (TBD)    Equipment Recommendations   (TBD)    Recommendations for Other Services       Precautions / Restrictions Precautions Precaution Comments: EVD in place Restrictions Weight Bearing Restrictions: No      Mobility  Bed Mobility                  Transfers                    Ambulation/Gait                Stairs            Wheelchair Mobility    Modified Rankin (Stroke Patients Only) Modified Rankin (Stroke Patients Only) Pre-Morbid Rankin Score: No symptoms Modified Rankin: Severe disability     Balance                                             Pertinent Vitals/Pain      Home Living Family/patient expects to be discharged to:: Inpatient rehab Living Arrangements: Spouse/significant other                    Prior Function Level of Independence: Independent         Comments: was on the tredmill before event     Hand Dominance        Extremity/Trunk Assessment   Upper Extremity Assessment Upper Extremity Assessment: Defer to OT evaluation (withdraws to pain)    Lower Extremity Assessment Lower Extremity Assessment: RLE deficits/detail;LLE deficits/detail (extremely limited active movement in BLEs) RLE Deficits / Details: moves toes upon  command, noted trace contraction when attempting to lift leg on command (withdraws to pain) LLE Deficits / Details: moves toes upon command; withdraws to pain       Communication      Cognition Arousal/Alertness: Lethargic   Overall Cognitive Status: Difficult to assess                                        General Comments      Exercises Other Exercises Other Exercises: PROM BLEs and UEs (Generalized ROM Protocol) Other Exercises: Heel cord stretches sustained bilaterally Other Exercises: Positioning for edema control   Assessment/Plan    PT Assessment Patient needs continued PT services  PT Problem List Decreased strength;Decreased range of motion;Decreased activity tolerance;Decreased balance;Decreased mobility;Decreased coordination       PT Treatment Interventions Functional mobility training;Therapeutic activities;Therapeutic exercise;Balance training;Patient/family education    PT Goals (Current goals can be found in the Care Plan section)  Acute Rehab PT Goals PT Goal Formulation: Patient unable to participate in goal setting  Frequency Min 3X/week   Barriers to discharge        Co-evaluation               AM-PAC PT "6 Clicks" Daily Activity  Outcome Measure Difficulty turning over in bed (including adjusting bedclothes, sheets and blankets)?: Unable Difficulty moving from lying on back to sitting on the side of the bed? : Unable Difficulty sitting down on and standing up from a chair with arms (e.g., wheelchair, bedside commode, etc,.)?: Unable Help needed moving to and from a bed to chair (including a wheelchair)?: Total Help needed walking in hospital room?: Total Help needed climbing 3-5 steps with a railing? : Total 6 Click Score: 6    End of Session   Activity Tolerance: Patient limited by lethargy     PT Visit Diagnosis: Other symptoms and signs involving the nervous system (R29.898)    Time: 1610-9604 PT Time  Calculation (min) (ACUTE ONLY): 21 min   Charges:   PT Evaluation $PT Eval High Complexity: 1 High     PT G Codes:        Charlotte Crumb, PT DPT  Board Certified Neurologic Specialist [phone]   Fabio Asa 12/06/2016, 4:20 PM

## 2016-12-06 NOTE — Progress Notes (Signed)
SLP Cancellation Note  Patient Details Name: Shelby Ayala MRN: [id number] DOB: 04/10/1950   Cancelled treatment:       Reason Eval/Treat Not Completed: Fatigue/lethargy limiting ability to participate   Tressie StalkerPat Brnadon Eoff, M.S., CCC-SLP 12/06/2016, 1:46 PM

## 2016-12-06 NOTE — Progress Notes (Signed)
Patient ID: Shelby Ayala, female   DOB: 05/17/1950, 66 y.o.   MRN: [id number] Patient will open eyes but she does follow commands sticking out her tongue some movement upper and lower extremities but could not do do this on command. Pupils are 5 mm and nonreactive this is at her baseline  MRI should scan shows small perforator strokes thalamus and midbrain  Continue hydration at 125 mL an hour will recheck BMP and CBC

## 2016-12-06 NOTE — Progress Notes (Signed)
Patient ID: Shelby Ayala, female   DOB: 08/06/1950, 66 y.o.   MRN: [id number] BP (!) 141/80   Pulse 67   Temp 99.4 F (37.4 C) (Axillary)   Resp 19   Ht 5\' 7"  (1.702 m)   Wt 63.5 kg (139 lb 15.9 oz)   SpO2 97%   BMI 21.93 kg/m  I spoke at length with the [facility] family about the MRI results, and expectations. I do believe the MRI explains the lethargy, and the pupillary changes, seen during her exam. I do believe she will improve, though there is certainly no set time period. The ventricular catheter is draining well. She has interacted with her family today, and is better than two days ago. Will continue current care.

## 2016-12-07 LAB — BASIC METABOLIC PANEL
ANION GAP: 11 (ref 5–15)
BUN: 13 mg/dL (ref 6–20)
CALCIUM: 8.5 mg/dL — AB (ref 8.9–10.3)
CO2: 21 mmol/L — ABNORMAL LOW (ref 22–32)
Chloride: 109 mmol/L (ref 101–111)
Creatinine, Ser: 0.42 mg/dL — ABNORMAL LOW (ref 0.44–1.00)
GFR calc Af Amer: >60 mL/min (ref >60)
GLUCOSE: 139 mg/dL — AB (ref 65–99)
Potassium: 2.9 mmol/L — ABNORMAL LOW (ref 3.5–5.1)
SODIUM: 141 mmol/L (ref 135–145)

## 2016-12-07 LAB — GLUCOSE, CAPILLARY
GLUCOSE-CAPILLARY: 126 mg/dL — AB (ref 65–99)
GLUCOSE-CAPILLARY: 126 mg/dL — AB (ref 65–99)
GLUCOSE-CAPILLARY: 111 mg/dL — AB (ref 65–99)
Glucose-Capillary: 117 mg/dL — ABNORMAL HIGH (ref 65–99)
Glucose-Capillary: 120 mg/dL — ABNORMAL HIGH (ref 65–99)
Glucose-Capillary: 127 mg/dL — ABNORMAL HIGH (ref 65–99)
Glucose-Capillary: 145 mg/dL — ABNORMAL HIGH (ref 65–99)

## 2016-12-07 MED ORDER — POTASSIUM CHLORIDE 20 MEQ/15ML (10%) PO SOLN
20.0000 meq | Freq: Once | ORAL | Status: AC
  Administered 2016-12-07: 20 meq
  Filled 2016-12-07: qty 15

## 2016-12-07 MED ORDER — LOPERAMIDE HCL 1 MG/5ML PO LIQD
2.0000 mg | ORAL | Status: DC | PRN
  Administered 2016-12-07 – 2016-12-18 (×15): 2 mg
  Filled 2016-12-07 (×15): qty 10

## 2016-12-07 MED ORDER — CHLORHEXIDINE GLUCONATE 0.12 % MT SOLN
15.0000 mL | Freq: Two times a day (BID) | OROMUCOSAL | Status: DC
  Administered 2016-12-07 – 2016-12-08 (×4): 15 mL via OROMUCOSAL
  Filled 2016-12-07 (×3): qty 15

## 2016-12-07 MED ORDER — ORAL CARE MOUTH RINSE
15.0000 mL | Freq: Two times a day (BID) | OROMUCOSAL | Status: DC
Start: 2016-12-07 — End: 2016-12-09
  Administered 2016-12-07 – 2016-12-08 (×4): 15 mL via OROMUCOSAL

## 2016-12-07 MED ORDER — POTASSIUM CHLORIDE IN NACL 20-0.9 MEQ/L-% IV SOLN
INTRAVENOUS | Status: DC
  Administered 2016-12-07 – 2016-12-10 (×8): via INTRAVENOUS
  Filled 2016-12-07 (×10): qty 1000

## 2016-12-07 NOTE — Evaluation (Signed)
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   Clinical Impression   Per chart review, pt independent PTA. Currently pt requires total assist +2 for ADL and functional mobility. Pt tolerated sitting EOB x3 minutes today with min-mod assist for sitting balance. Pt with eyes closed throughout session and not answering questions but does intermittently follow one step commands. Pt presenting with decreased strength and AROM bil UEs, impaired balance, and impaired cognition impacting her independence and safety with ADL and functional mobility. Recommending CIR level therapies to maximize independence and safety with ADL and functional mobility prior to return home. Pt would benefit from continued skilled OT to address established goals.    Follow Up Recommendations  CIR;Supervision/Assistance - 24 hour    Equipment Recommendations  Other (comment) (TBD at next venue)    Recommendations for Other Services       Precautions / Restrictions Precautions Precautions: Fall Precaution Comments: EVD in place Restrictions Weight Bearing Restrictions: No      Mobility Bed Mobility Overal bed mobility: Needs Assistance Bed Mobility: Supine to Sit     Supine to sit: Total assist;+2 for physical assistance Sit to supine: Total assist;+2 for physical assistance   General bed mobility comments: Total assist for all aspects of bed mobility at this time.   Transfers Overall transfer level: Needs assistance Equipment used: 2 person hand held assist Transfers: Sit to/from UGI CorporationStand;Stand Pivot Transfers Sit to Stand: Total assist;+2 physical assistance Stand pivot transfers: Total assist;+2 physical assistance       General transfer comment: Total assist +2 for sit to  stand with use of bed pad to boost up and for stand pivot transfer EOB>chair.      Balance Overall balance assessment: Needs assistance Sitting-balance support: Feet supported Sitting balance-Leahy Scale: Poor Sitting balance - Comments: min-mod assist for sitting balance Postural control: Posterior lean   Standing balance-Leahy Scale: Zero Standing balance comment: total assist +2 for standing balance                           ADL either performed or assessed with clinical judgement   ADL Overall ADL's : Needs assistance/impaired Eating/Feeding: NPO                                     General ADL Comments: Pt currently requires total assist for ADL and mobility.     Vision   Additional Comments: Eyes closed thoughout session. Needs further assessment.     Perception     Praxis      Pertinent Vitals/Pain       Hand Dominance     Extremity/Trunk Assessment Upper Extremity Assessment Upper Extremity Assessment: RUE deficits/detail;LUE deficits/detail RUE Deficits / Details: No active movement noted. Does withdraw to painful stimuli. LUE Deficits / Details: No active movement noted. Does withdraw to painful stimuli.   Lower Extremity Assessment Lower Extremity Assessment: Defer to PT evaluation       Communication     Cognition Arousal/Alertness: Lethargic   Overall Cognitive Status: Difficult to assess  General Comments: Eyes closed thoughout, does not respond to questions. Intermittently follows one step commands.   General Comments       Exercises     Shoulder Instructions      Home Living Family/patient expects to be discharged to:: Inpatient rehab Living Arrangements: Spouse/significant other                                      Prior Functioning/Environment Level of Independence: Independent                 OT Problem List: Decreased  strength;Decreased range of motion;Decreased activity tolerance;Impaired balance (sitting and/or standing);Impaired vision/perception;Decreased coordination;Decreased cognition;Decreased safety awareness;Decreased knowledge of use of DME or AE;Impaired sensation;Impaired tone;Impaired UE functional use;Increased edema      OT Treatment/Interventions: Self-care/ADL training;Therapeutic exercise;Neuromuscular education;Energy conservation;DME and/or AE instruction;Therapeutic activities;Cognitive remediation/compensation;Visual/perceptual remediation/compensation;Patient/family education;Balance training    OT Goals(Current goals can be found in the care plan section) Acute Rehab OT Goals Patient Stated Goal: none stated OT Goal Formulation: Patient unable to participate in goal setting Time For Goal Achievement: 12/21/16 Potential to Achieve Goals: Good ADL Goals Additional ADL Goal #1: Pt will tolerate sitting EOB x5 minutes with min guard assist as precursor to ADL. Additional ADL Goal #2: Pt will follow one step command 50% of the time in a non distracting environment.  OT Frequency: Min 3X/week   Barriers to D/C:            Co-evaluation PT/OT/SLP Co-Evaluation/Treatment: Yes Reason for Co-Treatment: Complexity of the patient's impairments (multi-system involvement);For patient/therapist safety   OT goals addressed during session: ADL's and self-care      AM-PAC PT "6 Clicks" Daily Activity     Outcome Measure Help from another person eating meals?: Total Help from another person taking care of personal grooming?: Total Help from another person toileting, which includes using toliet, bedpan, or urinal?: Total Help from another person bathing (including washing, rinsing, drying)?: Total Help from another person to put on and taking off regular upper body clothing?: Total Help from another person to put on and taking off regular lower body clothing?: Total 6 Click Score: 6   End  of Session Equipment Utilized During Treatment: Gait belt Nurse Communication: Mobility status  Activity Tolerance: Patient tolerated treatment well Patient left: in chair;with call bell/phone within reach;with family/visitor present;with nursing/sitter in room  OT Visit Diagnosis: Unsteadiness on feet (R26.81);Other abnormalities of gait and mobility (R26.89);Muscle weakness (generalized) (M62.81)                Time: 1610-9604 OT Time Calculation (min): 24 min Charges:  OT General Charges $OT Visit: 1 Visit OT Evaluation $OT Eval Moderate Complexity: 1 Mod G-Codes:     Mihir Flanigan A. Brett Albino, M.S., OTR/L Pager: [phone]  Gaye Alken 12/07/2016, 2:39 PM

## 2016-12-07 NOTE — Progress Notes (Signed)
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    PT Comments    Patient seen in conjunction with OT therapist for OOB and arousal. Pt with eyes closed throughout session and not answering questions but does intermittently follow one step commands. Tolerated transfer and OOB well, VSS. Will continue to monitor progress.  Follow Up Recommendations  CIR (TBD pending progress)     Equipment Recommendations   (TBD)    Recommendations for Other Services       Precautions / Restrictions Precautions Precautions: Fall Precaution Comments: EVD in place Restrictions Weight Bearing Restrictions: No    Mobility  Bed Mobility Overal bed mobility: Needs Assistance Bed Mobility: Supine to Sit     Supine to sit: Total assist;+2 for physical assistance Sit to supine: Total assist;+2 for physical assistance   General bed mobility comments: Total assist for all aspects of bed mobility at this time.   Transfers Overall transfer level: Needs assistance Equipment used: 2 person hand held assist Transfers: Sit to/from [employer]GI CorporationStand;Stand Pivot Transfers Sit to Stand: Total assist;+2 physical assistance Stand pivot transfers: Total assist;+2 physical assistance       General transfer comment: Total assist +2 for sit to stand with use of bed pad to boost up and for stand pivot transfer EOB>chair.    Ambulation/Gait                 Stairs            Wheelchair Mobility    Modified Rankin (Stroke Patients Only)       Balance Overall balance assessment: Needs assistance Sitting-balance support: Feet supported Sitting balance-Leahy Scale: Poor Sitting balance - Comments: min-mod assist for sitting balance Postural control: Posterior lean   Standing  balance-Leahy Scale: Zero Standing balance comment: total assist +2 for standing balance                            Cognition Arousal/Alertness: Lethargic   Overall Cognitive Status: Difficult to assess                                 General Comments: Eyes closed thoughout, does not respond to questions. Intermittently follows one step commands.      Exercises      General Comments        Pertinent Vitals/Pain      Home Living Family/patient expects to be discharged to:: Inpatient rehab Living Arrangements: Spouse/significant other                  Prior Function Level of Independence: Independent          PT Goals (current goals can now be found in the care plan section) Acute Rehab PT Goals Patient Stated Goal: none stated PT Goal Formulation: Patient unable to participate in goal setting Progress towards PT goals: Progressing toward goals    Frequency    Min 3X/week      PT Plan Current plan remains appropriate    Co-evaluation PT/OT/SLP Co-Evaluation/Treatment: Yes Reason for Co-Treatment: Complexity of the patient's impairments (multi-system involvement);For patient/therapist safety   OT goals addressed during session: ADL's and self-care      AM-PAC PT "  6 Clicks" Daily Activity  Outcome Measure  Difficulty turning over in bed (including adjusting bedclothes, sheets and blankets)?: Unable Difficulty moving from lying on back to sitting on the side of the bed? : Unable Difficulty sitting down on and standing up from a chair with arms (e.g., wheelchair, bedside commode, etc,.)?: Unable Help needed moving to and from a bed to chair (including a wheelchair)?: Total Help needed walking in hospital room?: Total Help needed climbing 3-5 steps with a railing? : Total 6 Click Score: 6    End of Session         PT Visit Diagnosis: Other symptoms and signs involving the nervous system (Z61.096)     Time:  -      Charges:  $Therapeutic Activity: 8-22 mins                    G Codes:       Charlotte Crumb, PT DPT  Board Certified Neurologic Specialist [phone]    Fabio Asa 12/07/2016, 4:09 PM

## 2016-12-07 NOTE — Progress Notes (Signed)
Subjective: Patient reports Remains intubated response to voice  Objective: Vital signs in last 24 hours: Temp:  [98.8 F (37.1 C)-100.3 F (37.9 C)] 99.7 F (37.6 C) (09/09 0744) Pulse Rate:  [58-76] 70 (09/09 0600) Resp:  [14-22] 18 (09/09 0734) BP: (115-165)/(64-85) 157/85 (09/09 0734) SpO2:  [95 %-100 %] 99 % (09/09 0734) Weight:  [63.4 kg (139 lb 12.4 oz)] 63.4 kg (139 lb 12.4 oz) (09/09 0500)  Intake/Output from previous day: 09/08 0701 - 09/09 0700 In: 4420 [I.V.:3000; NG/GT:1200; IV Piggyback:220] Out: 3381 [Urine:3275; Drains:106] Intake/Output this shift: No intake/output data recorded.  Pupils reactive will follow commands with tongue some purposeful movements to deep stimulation  Lab Results:  Recent Labs  12/06/16 0947  WBC 11.3*  HGB 12.8  HCT 37.6  PLT 145*   BMET  Recent Labs  12/06/16 0947 12/07/16 0222  NA 137 141  K 2.8* 2.9*  CL 104 109  CO2 24 21*  GLUCOSE 142* 139*  BUN 10 13  CREATININE 0.46 0.42*  CALCIUM 8.5* 8.5*    Studies/Results: Mr Brain Wo Contrast  Result Date: 12/06/2016 CLINICAL DATA:  Decreased level of consciousness, suspected mid brain dysfunction. Status post subarachnoid hemorrhage and basilar artery endovascular embolization. EXAM: MRI HEAD WITHOUT CONTRAST TECHNIQUE: Multiplanar, multiecho pulse sequences of the brain and surrounding structures were obtained without intravenous contrast. COMPARISON:  CT HEAD December 04, 2016 FINDINGS: BRAIN: Patchy reduced diffusion RIGHT greater than LEFT medial thalamus with low ADC values an T2 hyperintense signal. subcentimeter foci of reduced diffusion bilateral occipital lobes and cerebellum. Punctate foci of reduced diffusion versus artifact parietal lobes given presence of subarachnoid hemorrhage with FLAIR T2 hyperintense signal. Patchy supratentorial and pontine white matter FLAIR T2 hyperintensities exclusive of the aforementioned abnormality. 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  Ventriculostomy catheter via RIGHT frontal burr hole, distal tip RIGHT lateral ventricle. Dependent blood products within the occipital horns. No hydrocephalus. No intraparenchymal susceptibility artifact. No midline shift, mass effect or masses. VASCULAR: Susceptibility artifact basilar tip corresponding to coil embolic material. Major intracranial vascular flow voids present at skull base. SKULL AND UPPER CERVICAL SPINE: No abnormal sellar expansion. No suspicious calvarial bone marrow signal. Craniocervical junction maintained. SINUSES/ORBITS: The mastoid air-cells and included paranasal sinuses are well-aerated. The included ocular globes and orbital contents are non-suspicious. OTHER: Nasogastric tube present. IMPRESSION: 1. Status post coil embolization of basilar artery aneurysm. 2. 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 3. Subarachnoid hemorrhage and intraventricular blood products. RIGHT frontal ventriculostomy catheter without hydrocephalus. 4. Punctate parietal infarcts versus susceptibility artifact from subarachnoid hemorrhage. 5. Moderate chronic small vessel ischemic disease. Old small cerebellar infarcts. Electronically Signed   By: Awilda Metroourtnay  Bloomer M.D.   On: 12/06/2016 02:26   Dg Chest Port 1 View  Result Date: 12/06/2016 CLINICAL DATA:  Status post aneurysm coiling. EXAM: PORTABLE CHEST 1 VIEW COMPARISON:  None. FINDINGS: An NG tube terminates below today's film. There is a torturous thoracic aorta. The hila and mediastinum are normal. No pneumothorax. No nodules or masses. No focal infiltrate. IMPRESSION: No active disease. Electronically Signed   By: Gerome Samavid  Williams III M.D   On: 12/06/2016 09:08    Assessment/Plan: Post-bleed day 4 post coil day 4 patient intermittently will follow commands appears to be neurologically stable MRI scan shows small diencephalic strokes small mid brain strokes as well. Continue strict blood pressure  management. Patient has some diarrhea possibly secondary to tube feeds will treat with Imodium possibly will need to feed change tomorrow  with nutrition  LOS: 4 days     Niguel Moure P 12/07/2016, 8:10 AM

## 2016-12-07 NOTE — Progress Notes (Signed)
SLP Cancellation Note  Patient Details Name: Shelby Ayala MRN: [id number] DOB: 04/26/1950   Cancelled treatment:       Reason Eval/Treat Not Completed: Fatigue/lethargy limiting ability to participate.  ST will follow up next date.  Dimas AguasMelissa Adalynne Steffensmeier, MA, CCC-SLP Acute Rehab SLP [phone] Fleet ContrasMelissa N Riggs Dineen 12/07/2016, 1:57 PM

## 2016-12-07 NOTE — Progress Notes (Signed)
Rehab Admissions Coordinator Note:  Patient was screened by Shelby DupesBoyette, Shelby Ayala for appropriateness for an Inpatient Acute Rehab Consult per PT and OT recommendation,   At this time, we are recommending Inpatient Rehab consult when appropriate.  Shelby Ayala.Shelby Ayala 12/07/2016, 5:25 PM  I can be reached at [phone].

## 2016-12-08 ENCOUNTER — Inpatient Hospital Stay (HOSPITAL_COMMUNITY): Payer: Medicare Other

## 2016-12-08 DIAGNOSIS — I609 Nontraumatic subarachnoid hemorrhage, unspecified: Secondary | ICD-10-CM

## 2016-12-08 LAB — BASIC METABOLIC PANEL
ANION GAP: 8 (ref 5–15)
BUN: 13 mg/dL (ref 6–20)
CALCIUM: 8.8 mg/dL — AB (ref 8.9–10.3)
CO2: 26 mmol/L (ref 22–32)
Chloride: 107 mmol/L (ref 101–111)
Creatinine, Ser: 0.49 mg/dL (ref 0.44–1.00)
Glucose, Bld: 164 mg/dL — ABNORMAL HIGH (ref 65–99)
Potassium: 3.2 mmol/L — ABNORMAL LOW (ref 3.5–5.1)
Sodium: 141 mmol/L (ref 135–145)

## 2016-12-08 LAB — GLUCOSE, CAPILLARY
GLUCOSE-CAPILLARY: 154 mg/dL — AB (ref 65–99)
GLUCOSE-CAPILLARY: 139 mg/dL — AB (ref 65–99)
Glucose-Capillary: 120 mg/dL — ABNORMAL HIGH (ref 65–99)
Glucose-Capillary: 147 mg/dL — ABNORMAL HIGH (ref 65–99)
Glucose-Capillary: 162 mg/dL — ABNORMAL HIGH (ref 65–99)
Glucose-Capillary: 138 mg/dL — ABNORMAL HIGH (ref 65–99)

## 2016-12-08 LAB — HIV ANTIBODY (ROUTINE TESTING W REFLEX): HIV SCREEN 4TH GENERATION: NONREACTIVE

## 2016-12-08 NOTE — Progress Notes (Signed)
Transcranial Doppler  Date POD PCO2 HCT BP  MCA ACA PCA OPHT SIPH VERT Basilar  9/6/rs     Right  Left   45  47   -38  -30   18  25   18  18    42  31   -18  -23   -35      9/7,rds     Right  Left   45  43   -49  -25   27  27   23  17    38  44   -21  -20   *      9/7rs,vs     Right  Left   57  57   -55  -46   31  *   21  18   37  42   -34  -32   -56            Right  Left                                             Right  Left                                            Right  Left                                            Right  Left                                        MCA = Middle Cerebral Artery      OPHT = Opthalmic Artery     BASILAR = Basilar Artery   ACA = Anterior Cerebral Artery     SIPH = Carotid Siphon PCA = Posterior Cerebral Artery   VERT = Verterbral Artery                   Normal MCA = 62+\-12 ACA = 50+\-12 PCA = 42+\-23   9/7 - (*) not insonated. rds 9/10 - (*) not insonated. rds and vs.

## 2016-12-08 NOTE — Plan of Care (Signed)
Problem: Spontaneous Subarachnoid Hemorrhage Tissue Perfusion: Goal: Complications of Spontaneous Subarachnoid Hemorrhage will be minimized Outcome: Not Progressing Continuing to monitor patient.  Currently not responding but had been sporadically prior to yesterday afternoon.  Heloise PurpuraSusan Jacobo Moncrief RN

## 2016-12-08 NOTE — Progress Notes (Signed)
   12/07/16 1800 12/07/16 1900  Neurological  Neuro (WDL) X X  Level of Consciousness Responds to Voice Responds to Pain  Orientation Level Disoriented to place;Disoriented to time;Disoriented to situation Disoriented X4  Cognition Poor attention/concentration;Poor safety awareness Unable to follow commands  Speech Delayed responses Incomprehensible (moans and groans)  Pupil Assessment  Yes Yes  R Pupil Size (mm) 5 5  R Pupil Shape Round Round  R Pupil Reaction Nonreactive Nonreactive  L Pupil Size (mm) 5 5  L Pupil Shape Round Round  L Pupil Reaction Nonreactive Nonreactive  Additional Pupil Assessments No No  R Hand Grip Moderate --   L Hand Grip Weak  --   RUE Motor Response --  No movment to painful stimulus  RUE Motor Strength 3 --   LUE Motor Response --  No movement to painful stimulus  LUE Motor Strength 3 --   RLE Motor Response --  Purposeful movement  RLE Motor Strength 3 --   LLE Motor Response --  Purposeful movement  LLE Motor Strength 3 --      On assessment pt was not following commands nor moving the upper extremities as was explained in report, and from my observations from the previous night. MD paged, made aware of this neuro change and no orders were placed at this time Will cont to monitor.

## 2016-12-08 NOTE — Progress Notes (Signed)
Patient ID: Shelby Ayala, female   DOB: 01/24/1951, 66 y.o.   MRN: [id number] BP (!) 159/89   Pulse 70   Temp 99.9 F (37.7 C) (Axillary)   Resp 19   Ht 5\' 7"  (1.702 m)   Wt 63.3 kg (139 lb 8.8 oz)   SpO2 98%   BMI 21.86 kg/m  Sleeping, not following commands. Breathing on her own.  CT scan this evening looks unchanged, basal cisterns patent, ventricular catheter in good position TCD velocities appear to be stable also Other than midbrain infarct which does offer an etiology, no other new issues identified. Pupils are large not reactive.

## 2016-12-08 NOTE — Evaluation (Signed)
Speech Language Pathology Evaluation Patient Details Name: Shelby Ayala H Pensinger MRN: [id number] DOB: 08/03/1950 Today's Date: 12/08/2016 Time: [id number] SLP Time Calculation (min) (ACUTE ONLY): 18 min  Problem List:  Patient Active Problem List   Diagnosis Date Noted  . Subarachnoid hemorrhage due to ruptured aneurysm (HCC) 12/03/2016   Past Medical History: History reviewed. No pertinent past medical history. Past Surgical History:  Past Surgical History:  Procedure Laterality Date  . IR 3D INDEPENDENT WKST  12/03/2016  . IR ANGIO INTRA EXTRACRAN SEL INTERNAL CAROTID BILAT MOD SED  12/03/2016  . IR ANGIO VERTEBRAL SEL VERTEBRAL UNI L MOD SED  12/03/2016  . IR ANGIOGRAM FOLLOW UP STUDY  12/03/2016  . IR ANGIOGRAM FOLLOW UP STUDY  12/03/2016  . IR ANGIOGRAM FOLLOW UP STUDY  12/03/2016  . IR ANGIOGRAM FOLLOW UP STUDY  12/03/2016  . IR ANGIOGRAM FOLLOW UP STUDY  12/03/2016  . IR ANGIOGRAM FOLLOW UP STUDY  12/03/2016  . IR ANGIOGRAM FOLLOW UP STUDY  12/03/2016  . IR NEURO EACH ADD'L AFTER BASIC UNI LEFT (MS)  12/03/2016  . IR TRANSCATH/EMBOLIZ  12/03/2016  . RADIOLOGY WITH ANESTHESIA N/A 12/03/2016   Procedure: RADIOLOGY WITH ANESTHESIA;  Surgeon: Lisbeth RenshawNundkumar, Neelesh, MD;  [facility];  Service: Radiology;  Laterality: N/A;   HPI:  66 y.o.femaleSAH d#2 s/p coiling basilar artery with ventriculostomy. Pt remained lethargic and minimally responsive days after surgery. F/u MRI shows small infarcts in the thalmus and midbrain.    Assessment / Plan / Recommendation Clinical Impression  Pt demonstrates significantly impaired cognition due to lethargy following hemmorhage and new midbrain CVA. SLP utilized max tactile and hand over hand assist to encourage arousal and response to functional tasks. Pt noted to change respiratory pattern and lift eyebrows to verbal stimuli, oral care and washing face. Questionably followed command to clear her throat and sustain /a/, both with weak effort.  Will continue efforts to  facilitate arousal and cognitive recovery.     SLP Assessment  SLP Recommendation/Assessment: Patient needs continued Speech Lanaguage Pathology Services SLP Visit Diagnosis: Cognitive communication deficit (R41.841)    Follow Up Recommendations  Inpatient Rehab    Frequency and Duration min 3x week  2 weeks      SLP Evaluation Cognition  Overall Cognitive Status: Impaired/Different from baseline Arousal/Alertness: Lethargic Orientation Level: Other (comment) (has been oriented in prior days, no response today) Attention: Focused Focused Attention: Impaired Focused Attention Impairment: Verbal basic;Functional basic       Comprehension  Auditory Comprehension Overall Auditory Comprehension: Impaired Yes/No Questions: Impaired Basic Biographical Questions: 0-25% accurate Commands: Impaired One Step Basic Commands: 0-24% accurate    Expression     Oral / Motor  Oral Motor/Sensory Function Overall Oral Motor/Sensory Function: Other (comment) (does not follow commands or respond, gag present) Mandible: Within Functional Limits   GO                   Harlon DittyBonnie Jerico Grisso, [place] CCC-SLP [phone]  Claudine MoutonDeBlois, Finnick Orosz Caroline 12/08/2016, 9:55 AM

## 2016-12-09 ENCOUNTER — Inpatient Hospital Stay (HOSPITAL_COMMUNITY): Payer: Medicare Other

## 2016-12-09 DIAGNOSIS — J9601 Acute respiratory failure with hypoxia: Secondary | ICD-10-CM

## 2016-12-09 LAB — CBC WITH DIFFERENTIAL/PLATELET
BASOS ABS: 0 10*3/uL (ref 0.0–0.1)
Basophils Relative: 1 %
EOS ABS: 0 10*3/uL (ref 0.0–0.7)
EOS PCT: 2 %
HCT: 37.6 % (ref 36.0–46.0)
Hemoglobin: 12.2 g/dL (ref 12.0–15.0)
LYMPHS ABS: 0.7 10*3/uL (ref 0.7–4.0)
Lymphocytes Relative: 54 %
MCH: 31.5 pg (ref 26.0–34.0)
MCHC: 32.4 g/dL (ref 30.0–36.0)
MCV: 97.2 fL (ref 78.0–100.0)
MONO ABS: 0.1 10*3/uL (ref 0.1–1.0)
Monocytes Relative: 6 %
NEUTROS PCT: 37 %
Neutro Abs: 0.4 10*3/uL — ABNORMAL LOW (ref 1.7–7.7)
PLATELETS: 133 10*3/uL — AB (ref 150–400)
RBC: 3.87 MIL/uL (ref 3.87–5.11)
RDW: 14.7 % (ref 11.5–15.5)
WBC: 1.2 10*3/uL — AB (ref 4.0–10.5)

## 2016-12-09 LAB — URINALYSIS, ROUTINE W REFLEX MICROSCOPIC
Bilirubin Urine: NEGATIVE
GLUCOSE, UA: 50 mg/dL — AB
Hgb urine dipstick: NEGATIVE
KETONES UR: NEGATIVE mg/dL
NITRITE: POSITIVE — AB
PH: 5 (ref 5.0–8.0)
Protein, ur: 30 mg/dL — AB
SPECIFIC GRAVITY, URINE: 1.021 (ref 1.005–1.030)
Squamous Epithelial / LPF: NONE SEEN

## 2016-12-09 LAB — BASIC METABOLIC PANEL
Anion gap: 7 (ref 5–15)
BUN: 18 mg/dL (ref 6–20)
CALCIUM: 8.6 mg/dL — AB (ref 8.9–10.3)
CO2: 26 mmol/L (ref 22–32)
CREATININE: 0.52 mg/dL (ref 0.44–1.00)
Chloride: 111 mmol/L (ref 101–111)
GFR calc Af Amer: >60 mL/min (ref >60)
GLUCOSE: 128 mg/dL — AB (ref 65–99)
Potassium: 4.1 mmol/L (ref 3.5–5.1)
SODIUM: 144 mmol/L (ref 135–145)

## 2016-12-09 LAB — COMPREHENSIVE METABOLIC PANEL
ALK PHOS: 55 U/L (ref 38–126)
ALT: 19 U/L (ref 14–54)
ANION GAP: 10 (ref 5–15)
AST: 24 U/L (ref 15–41)
Albumin: 2.6 g/dL — ABNORMAL LOW (ref 3.5–5.0)
BILIRUBIN TOTAL: 1 mg/dL (ref 0.3–1.2)
BUN: 21 mg/dL — ABNORMAL HIGH (ref 6–20)
CALCIUM: 8.6 mg/dL — AB (ref 8.9–10.3)
CO2: 22 mmol/L (ref 22–32)
CREATININE: 0.55 mg/dL (ref 0.44–1.00)
Chloride: 114 mmol/L — ABNORMAL HIGH (ref 101–111)
Glucose, Bld: 135 mg/dL — ABNORMAL HIGH (ref 65–99)
Potassium: 3.2 mmol/L — ABNORMAL LOW (ref 3.5–5.1)
SODIUM: 146 mmol/L — AB (ref 135–145)
TOTAL PROTEIN: 5.4 g/dL — AB (ref 6.5–8.1)

## 2016-12-09 LAB — GLUCOSE, CAPILLARY
GLUCOSE-CAPILLARY: 139 mg/dL — AB (ref 65–99)
GLUCOSE-CAPILLARY: 84 mg/dL (ref 65–99)
GLUCOSE-CAPILLARY: 123 mg/dL — AB (ref 65–99)
GLUCOSE-CAPILLARY: 160 mg/dL — AB (ref 65–99)
Glucose-Capillary: 176 mg/dL — ABNORMAL HIGH (ref 65–99)
Glucose-Capillary: 198 mg/dL — ABNORMAL HIGH (ref 65–99)

## 2016-12-09 LAB — LACTIC ACID, PLASMA
LACTIC ACID, VENOUS: 2.1 mmol/L — AB (ref 0.5–1.9)
Lactic Acid, Venous: 1.9 mmol/L (ref 0.5–1.9)
Lactic Acid, Venous: 1.5 mmol/L (ref 0.5–1.9)

## 2016-12-09 LAB — PHOSPHORUS: PHOSPHORUS: 2.1 mg/dL — AB (ref 2.5–4.6)

## 2016-12-09 LAB — PROCALCITONIN: PROCALCITONIN: 0.31 ng/mL

## 2016-12-09 LAB — MAGNESIUM: MAGNESIUM: 1.8 mg/dL (ref 1.7–2.4)

## 2016-12-09 MED ORDER — FENTANYL CITRATE (PF) 100 MCG/2ML IJ SOLN
INTRAMUSCULAR | Status: AC
  Filled 2016-12-09: qty 2

## 2016-12-09 MED ORDER — ROCURONIUM BROMIDE 50 MG/5ML IV SOLN
40.0000 mg | Freq: Once | INTRAVENOUS | Status: AC
  Administered 2016-12-09: 40 mg via INTRAVENOUS

## 2016-12-09 MED ORDER — ORAL CARE MOUTH RINSE
15.0000 mL | Freq: Four times a day (QID) | OROMUCOSAL | Status: DC
  Administered 2016-12-09 (×3): 15 mL via OROMUCOSAL

## 2016-12-09 MED ORDER — PIPERACILLIN-TAZOBACTAM 3.375 G IVPB
3.3750 g | Freq: Three times a day (TID) | INTRAVENOUS | Status: DC
  Administered 2016-12-09 – 2016-12-11 (×5): 3.375 g via INTRAVENOUS
  Filled 2016-12-09 (×7): qty 50

## 2016-12-09 MED ORDER — SODIUM CHLORIDE 0.9 % IV BOLUS (SEPSIS)
1000.0000 mL | Freq: Once | INTRAVENOUS | Status: AC
  Administered 2016-12-09: 1000 mL via INTRAVENOUS

## 2016-12-09 MED ORDER — ETOMIDATE 2 MG/ML IV SOLN
20.0000 mg | Freq: Once | INTRAVENOUS | Status: AC
  Administered 2016-12-09: 20 mg via INTRAVENOUS

## 2016-12-09 MED ORDER — MAGNESIUM SULFATE 2 GM/50ML IV SOLN
2.0000 g | Freq: Once | INTRAVENOUS | Status: AC
  Administered 2016-12-09: 2 g via INTRAVENOUS
  Filled 2016-12-09: qty 50

## 2016-12-09 MED ORDER — MIDAZOLAM HCL 2 MG/2ML IJ SOLN
INTRAMUSCULAR | Status: AC
  Filled 2016-12-09: qty 2

## 2016-12-09 MED ORDER — PANTOPRAZOLE SODIUM 40 MG IV SOLR
40.0000 mg | Freq: Every day | INTRAVENOUS | Status: DC
  Administered 2016-12-09 – 2016-12-17 (×9): 40 mg via INTRAVENOUS
  Filled 2016-12-09 (×9): qty 40

## 2016-12-09 MED ORDER — PIPERACILLIN-TAZOBACTAM 3.375 G IVPB 30 MIN
3.3750 g | Freq: Once | INTRAVENOUS | Status: AC
  Administered 2016-12-09: 3.375 g via INTRAVENOUS
  Filled 2016-12-09: qty 50

## 2016-12-09 MED ORDER — VANCOMYCIN HCL IN DEXTROSE 750-5 MG/150ML-% IV SOLN
750.0000 mg | Freq: Two times a day (BID) | INTRAVENOUS | Status: DC
  Administered 2016-12-09: 750 mg via INTRAVENOUS
  Filled 2016-12-09 (×2): qty 150

## 2016-12-09 MED ORDER — NIMODIPINE 60 MG/20ML PO SOLN
30.0000 mg | ORAL | Status: DC
  Administered 2016-12-09 – 2016-12-18 (×102): 30 mg
  Filled 2016-12-09 (×74): qty 20

## 2016-12-09 MED ORDER — MIDAZOLAM HCL 2 MG/2ML IJ SOLN: 1.0000 mg | INTRAMUSCULAR | Status: DC | PRN

## 2016-12-09 MED ORDER — PHENYLEPHRINE HCL 10 MG/ML IJ SOLN
0.0000 ug/min | INTRAMUSCULAR | Status: DC
  Administered 2016-12-10: 10 ug/min via INTRAVENOUS
  Administered 2016-12-10: 60 ug/min via INTRAVENOUS
  Administered 2016-12-10: 140 ug/min via INTRAVENOUS
  Filled 2016-12-09 (×3): qty 1
  Filled 2016-12-09: qty 10

## 2016-12-09 MED ORDER — CHLORHEXIDINE GLUCONATE 0.12% ORAL RINSE (MEDLINE KIT)
15.0000 mL | Freq: Two times a day (BID) | OROMUCOSAL | Status: DC
  Administered 2016-12-09 – 2016-12-18 (×14): 15 mL via OROMUCOSAL

## 2016-12-09 MED ORDER — DEXAMETHASONE SODIUM PHOSPHATE 4 MG/ML IJ SOLN
4.0000 mg | Freq: Four times a day (QID) | INTRAMUSCULAR | Status: DC
  Administered 2016-12-09 – 2016-12-18 (×36): 4 mg via INTRAVENOUS
  Filled 2016-12-09 (×36): qty 1

## 2016-12-09 MED ORDER — CHLORHEXIDINE GLUCONATE 0.12% ORAL RINSE (MEDLINE KIT)
15.0000 mL | Freq: Two times a day (BID) | OROMUCOSAL | Status: DC
  Administered 2016-12-09 – 2016-12-13 (×5): 15 mL via OROMUCOSAL

## 2016-12-09 MED ORDER — VANCOMYCIN HCL 10 G IV SOLR
1250.0000 mg | Freq: Once | INTRAVENOUS | Status: AC
  Administered 2016-12-09: 1250 mg via INTRAVENOUS
  Filled 2016-12-09: qty 1250

## 2016-12-09 MED ORDER — POTASSIUM CHLORIDE 20 MEQ/15ML (10%) PO SOLN
40.0000 meq | Freq: Once | ORAL | Status: AC
  Administered 2016-12-09: 40 meq
  Filled 2016-12-09: qty 30

## 2016-12-09 MED ORDER — NIMODIPINE 30 MG PO CAPS: 30.0000 mg | ORAL_CAPSULE | ORAL | Status: DC

## 2016-12-09 MED ORDER — ORAL CARE MOUTH RINSE
15.0000 mL | OROMUCOSAL | Status: DC
  Administered 2016-12-09 – 2016-12-18 (×86): 15 mL via OROMUCOSAL

## 2016-12-09 MED ORDER — FENTANYL CITRATE (PF) 100 MCG/2ML IJ SOLN
25.0000 ug | INTRAMUSCULAR | Status: DC | PRN
  Administered 2016-12-09 – 2016-12-13 (×6): 50 ug via INTRAVENOUS
  Filled 2016-12-09 (×6): qty 2

## 2016-12-09 MED ORDER — ASPIRIN 81 MG PO CHEW
81.0000 mg | CHEWABLE_TABLET | Freq: Every day | ORAL | Status: DC
  Administered 2016-12-09 – 2016-12-17 (×9): 81 mg
  Filled 2016-12-09 (×9): qty 1

## 2016-12-09 NOTE — Progress Notes (Signed)
Pt became gradually became hypotensive this afternoon, unable to give Nimotop. Nundkumar aware and split dose 30 mg q 2. Notified Dr. Belia HemanKasa with CCM about hypotension and run of SVT. See associated orders. Will continue to monitor.

## 2016-12-09 NOTE — Progress Notes (Signed)
PT Cancellation Note  Patient Details Name: Shelby Ayala MRN: [id number] DOB: 01/19/1951   Cancelled Treatment:    Reason Eval/Treat Not Completed: Medical issues which prohibited therapy (Called nurse and nurse expressed that pt was inappropriate for PT today due to recent intubation.)   Tamani Durney SPT Simeon Vera 12/09/2016, 1:42 PM

## 2016-12-09 NOTE — Progress Notes (Signed)
eLink Physician-Brief Progress Note Patient Name: Loanne Drillingaula H Steckman DOB: 10/31/1950 MRN: [id number]   Date of Service  12/09/2016  HPI/Events of Note  Low BP, fevers  eICU Interventions  IVF NS bolus with NEO if needed     Intervention Category Evaluation Type: Other  Erin FullingKurian Letecia Arps 12/09/2016, 6:42 PM

## 2016-12-09 NOTE — Care Management Note (Signed)
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  PTA, pt independent, lives with spouse.                    Action/Plan: Pt intubated this AM, due to AMS and being unable to protect airway.  Will continue to follow progress.    Expected Discharge Date:                  Expected Discharge Plan:  IP Rehab Facility  In-House Referral:     Discharge planning Services  CM Consult  Post Acute Care Choice:    Choice offered to:     DME Arranged:    DME Agency:     HH Arranged:    HH Agency:     Status of Service:  In process, will continue to follow  If discussed at Long Length of Stay Meetings, dates discussed:    Additional Comments:  Quintella BatonJulie W. Firmin Belisle, RN, BSN  Trauma/Neuro ICU Case Manager [phone]

## 2016-12-09 NOTE — Progress Notes (Signed)
Provided RN with ordered sputum sample at approximately 1024.

## 2016-12-09 NOTE — Progress Notes (Signed)
ABG results(post intubation) to MD, Jamison NeighborNestor via telephone at approximately 1235 (unable to send results via I-STAT).  Vent settings: VT 490 RR 18 Fi02 100% PEEP 5  Temp corrected 100.8 PH 7.465 pc02 42.5 p02 395 Hc03 30.3

## 2016-12-09 NOTE — Progress Notes (Signed)
Pt seen and examined. Has been less responsive over last day. Significant secretions noted this am with concern for aspiriation/airway protection. She was intubated by PCCM.  EXAM: Temp:  [98.4 F (36.9 C)-102.4 F (39.1 C)] 100.8 F (38.2 C) (09/11 1137) Resp:  [16-24] 24 (09/11 1200) BP: (117-159)/(58-138) 151/138 (09/11 1200) SpO2:  [93 %-100 %] 99 % (09/11 1230) FiO2 (%):  [40 %-100 %] 40 % (09/11 1230) Weight:  [64 kg (141 lb 1.5 oz)] 64 kg (141 lb 1.5 oz) (09/11 0414) Intake/Output      09/10 0701 - 09/11 0700 09/11 0701 - 09/12 0700   I.V. (mL/kg) 3125 (48.8) 250 (3.9)   NG/GT 1250 100   IV Piggyback 330    Total Intake(mL/kg) 4705 (73.5) 350 (5.5)   Urine (mL/kg/hr) 2550 (1.7) 900 (2.2)   Drains 168 28   Total Output 2718 928   Net +1987 -578        Urine Occurrence 1 x    Stool Occurrence 2 x     No eye opening Pupils non-reactive Minimal movement to noxious stim EVD in place, functional  LABS: Lab Results  Component Value Date   CREATININE 0.52 12/09/2016   BUN 18 12/09/2016   NA 144 12/09/2016   K 4.1 12/09/2016   CL 111 12/09/2016   CO2 26 12/09/2016   Lab Results  Component Value Date   WBC 11.3 (H) 12/06/2016   HGB 12.8 12/06/2016   HCT 37.6 12/06/2016   MCV 91.9 12/06/2016   PLT 145 (L) 12/06/2016    IMAGING: CTH reviewed, no acute changes. Stable appearance of small bilateral diencephalic/mesencephalic strokes.  IMPRESSION: - 66 y.o. female SAH d# 7 s/p coiling basilar aneurysm with small thalamic and midbrain strokes. Required intubation for decreased LOC today.  PLAN: - Cont supportive care - Will add dexamethasone today - F/U culture results, empiric abx [facility] be [facility]

## 2016-12-09 NOTE — Progress Notes (Signed)
SLP Cancellation Note  Patient Details Name: Shelby Ayala MRN: [id number] DOB: 02/03/1951   Cancelled treatment:       Reason Eval/Treat Not Completed: Medical issues which prohibited therapy. Pt now intubated.    Milla Wahlberg, Riley NearingBonnie Caroline 12/09/2016, 10:14 AM

## 2016-12-09 NOTE — Procedures (Signed)
Indication:  Altered mental status with worsening hypoxic respiratory failure.  Consent: Obtained verbal consent from patient's son and husband at bedside prior to emergent procedure.  Medications: 1. Etomidate 20 mg IV push 2. Rocuronium 40 mg IV push  Description of Procedure: Patient laid recumbent. Patient was preoxygenated with bag mask ventilation. Rapid sequence intubation was performed utilizing etomidate for sedation followed by rocuronium for paralysis. Endotracheal intubation was performed with direct laryngoscopy and a 7.5 endotracheal tube. Tube was successfully inserted with bilateral breath sounds on auscultation and symmetric chest wall rise. Repeat capnographic color change was appreciated. Tube was secured at 22 cm at the lip/teeth.  Complications:  None.  Condition Post Procedure: Remains critically ill. Awaiting post intubation portable CXR & ABG.

## 2016-12-09 NOTE — Progress Notes (Signed)
Pharmacy Antibiotic Note  Shelby Ayala is a 66 y.o. female admitted on 12/03/2016 with sepsis.  Pharmacy has been consulted for Zosyn and vancomycin dosing. CrCl ~ 70 mL/min.   Plan: -Zosyn 3.375 gm IV Q 8 hours.  -Vancomycin 1250 mg IV once, then 750 mg IV Q 12 hours -Monitor CBC, renal fx, cultures and clinical progress -VT at SS   Height: 5\' 7"  (170.2 cm) Weight: 141 lb 1.5 oz (64 kg) IBW/kg (Calculated) : 61.6  Temp (24hrs), Avg:100.3 F (37.9 C), Min:98.4 F (36.9 C), Max:102.4 F (39.1 C)   Recent Labs Lab 12/03/16 1047 12/03/16 1055 12/03/16 1058 12/03/16 1436 12/06/16 0947 12/07/16 0222 12/08/16 0157 12/09/16 0227  WBC 11.5*  --   --  17.4* 11.3*  --   --   --   CREATININE 0.69 0.50  --   --  0.46 0.42* 0.49 0.52  LATICACIDVEN  --   --  2.85*  --   --   --   --   --     Estimated Creatinine Clearance: 68.2 mL/min (by C-G formula based on SCr of 0.52 mg/dL).    Allergies  Allergen Reactions  . Sulfa Antibiotics Hives  . Latex Rash    Antimicrobials this admission: 9/11 Vanc >>  9/11 Zosyn >>  Dose adjustments this admission: None  Microbiology results: 9/11 BCx:  9/11 TA>>  Thank you for allowing pharmacy to be a part of this patient's care.  Vinnie LevelBenjamin Chrishon Martino, PharmD., BCPS Clinical Pharmacist Pager [phone]

## 2016-12-09 NOTE — Progress Notes (Signed)
Critical lab value WBC 1.2.  CCM notified.

## 2016-12-09 NOTE — Progress Notes (Signed)
Nutrition Follow-up  INTERVENTION:   Vital AF 1.2 @ 50 ml/h (1200 ml per day) 30 ml Prostat daily   TF regimen to provide 1540 kcals, 105 gm protein, 973 ml of free water daily   NUTRITION DIAGNOSIS:   Inadequate oral intake related to inability to eat as evidenced by NPO status. Ongoing.   GOAL:   Patient will meet greater than or equal to 90% of their needs Met.   MONITOR:   Vent status, TF tolerance, Labs, Weight trends, Skin, I & O's  ASSESSMENT:   66 y.o. Female; while working out on a treadmill stepped off the treadmill and was unresponsive. Head CT showed large amount of subarachnoid blood.  Pt discussed during ICU rounds and with RN.  Pt extubated yesterday but re-intubated this am.   Patient is currently intubated on ventilator support MV: 8.2 L/min Temp (24hrs), Avg:100.5 F (38.1 C), Min:98.4 F (36.9 C), Max:102.4 F (39.1 C)  Medications reviewed.  IVF: NS with 20 mEq KCl/L @ 125 ml/hr  Labs reviewed CBG's: [phone]  TF: Vital AF 1.2 @ 50 ml/hr with 30 ml Prostat daily (1540 kcals, 105 gm protein, 973 ml of free water daily)  Diet Order:  Diet NPO time specified  Skin:  Wound (see comment) (groin puncture )  Last BM:  9/10 medium  Height:   Ht Readings from Last 1 Encounters:  12/03/16 _0  (1.702 m)    Weight:   Wt Readings from Last 1 Encounters:  12/09/16 141 lb 1.5 oz (64 kg)    Ideal Body Weight:  61.3 kg  BMI:  Body mass index is 22.1 kg/m.  Estimated Nutritional Needs:   Kcal:  1578  Protein:  100-115 gm  Fluid:  per MD  EDUCATION NEEDS:   No education needs identified at this time  [place], [place], [place] Pager [phone] After Hours Pager

## 2016-12-09 NOTE — Consult Note (Signed)
PULMONARY / CRITICAL CARE MEDICINE   Name: Shelby Ayala MRN: [id number] DOB: 07/04/1950    ADMISSION DATE:  12/03/2016 CONSULTATION DATE:  12/09/2016  REFERRING MD:  Conchita Paris / Neurosurgery  CHIEF COMPLAINT:  Altered Mental Status  HISTORY OF PRESENT ILLNESS:  66 y.o. female status post coiling. Patient postoperatively had a cerebral infarct. Intraventricular drain in place. Called to bedside today with continued nonresponsiveness. Patient with worsening oxygenation prompting emergent endotracheal intubation. Patient also notably febrile this morning. Further history cannot be obtained due to altered mental status.  PAST MEDICAL HISTORY : Unable to obtain with altered mentation.  PAST SURGICAL HISTORY: Past Surgical History:  Procedure Laterality Date  . IR 3D INDEPENDENT WKST  12/03/2016  . IR ANGIO INTRA EXTRACRAN SEL INTERNAL CAROTID BILAT MOD SED  12/03/2016  . IR ANGIO VERTEBRAL SEL VERTEBRAL UNI L MOD SED  12/03/2016  . IR ANGIOGRAM FOLLOW UP STUDY  12/03/2016  . IR ANGIOGRAM FOLLOW UP STUDY  12/03/2016  . IR ANGIOGRAM FOLLOW UP STUDY  12/03/2016  . IR ANGIOGRAM FOLLOW UP STUDY  12/03/2016  . IR ANGIOGRAM FOLLOW UP STUDY  12/03/2016  . IR ANGIOGRAM FOLLOW UP STUDY  12/03/2016  . IR ANGIOGRAM FOLLOW UP STUDY  12/03/2016  . IR NEURO EACH ADD'L AFTER BASIC UNI LEFT (MS)  12/03/2016  . IR TRANSCATH/EMBOLIZ  12/03/2016  . RADIOLOGY WITH ANESTHESIA N/A 12/03/2016   Procedure: RADIOLOGY WITH ANESTHESIA;  Surgeon: Lisbeth Renshaw, MD;  Location: [facility] OR;  Service: Radiology;  Laterality: N/A;    Allergies  Allergen Reactions  . Sulfa Antibiotics Hives  . Latex Rash    No current facility-administered medications on file prior to encounter.    No current outpatient prescriptions on file prior to encounter.    FAMILY HISTORY:  Unable to obtain with patient altered mentation.  SOCIAL HISTORY: Social History   Social History  . Marital status: Married    Spouse name: N/A  . Number of children:  N/A  . Years of education: N/A   Social History Main Topics  . Smoking status: Never Smoker  . Smokeless tobacco: Never Used  . Alcohol use None  . Drug use: Unknown  . Sexual activity: Not Asked   Other Topics Concern  . None   Social History Narrative  . None    REVIEW OF SYSTEMS:  Unable to obtain with altered mentation.  SUBJECTIVE: As above.  VITAL SIGNS: BP (!) 131/59   Pulse 70   Temp (!) 102.4 F (39.1 C) (Axillary)   Resp 18   Ht  (1.702 m)   Wt 141 lb 1.5 oz (64 kg)   SpO2 99%   BMI 22.10 kg/m   HEMODYNAMICS:    VENTILATOR SETTINGS:    INTAKE / OUTPUT: I/O last 3 completed shifts: In: 6630 [I.V.:4500; NG/GT:1800; IV Piggyback:330] Out: 3553 [Urine:3300; Drains:252; Stool:1]  PHYSICAL EXAMINATION: General:  Eyes closed. Husband and son at bedside. No distress.  Integument:  Warm. Dry. Intraventricular drain in place. Extremities:  No cyanosis or clubbing.  Lymphatics:  No appreciated cervical or supraclavicular lymphadenoapthy. HEENT:  Tacky mucous membranes. No scleral icterus. For gaze. Cardiovascular:  Regular rhythm. No JVD appreciated.  Pulmonary:  Coarse rhonchorous breath sounds bilaterally. No cough during the time of my exam. Normal work of breathing. Hypoxic on room air. Abdomen: Soft. Normal bowel sounds. Nondistended.  Musculoskeletal:  No joint deformity or effusion appreciated. Neurological:  Pupils symmetric and dilated. No response to tactile or verbal stimulation. Psychiatric:  Unable to assess with altered mental status.  LABS:  BMET  Recent Labs Lab 12/07/16 0222 12/08/16 0157 12/09/16 0227  NA 141 141 144  K 2.9* 3.2* 4.1  CL 109 107 111  CO2 21* 26 26  BUN CREATININE 0.42* 0.49 0.52  GLUCOSE 139* 164* 128*    Electrolytes  Recent Labs Lab 12/07/16 0222 12/08/16 0157 12/09/16 0227  CALCIUM 8.5* 8.8* 8.6*    CBC  Recent Labs Lab 12/03/16 1047 12/03/16 1055 12/03/16 1436 12/06/16 0947   WBC 11.5*  --  17.4* 11.3*  HGB 13.7 14.3 14.8 12.8  HCT 41.1 42.0 44.8 37.6  PLT 210  --  138* 145*    Coag's  Recent Labs Lab 12/03/16 1047 12/03/16 1436  APTT 25 24  INR 0.91 0.91    Sepsis Markers  Recent Labs Lab 12/03/16 1058  LATICACIDVEN 2.85*    ABG No results for input(s): PHART, PCO2ART, PO2ART in the last 168 hours.  Liver Enzymes  Recent Labs Lab 12/03/16 1047  AST 36  ALT 27  ALKPHOS 88  BILITOT 1.0  ALBUMIN 4.1    Cardiac Enzymes No results for input(s): TROPONINI, PROBNP in the last 168 hours.  Glucose  Recent Labs Lab 12/08/16 1129 12/08/16 1556 12/08/16 1931 12/08/16 2321 12/09/16 0315 12/09/16 0743  GLUCAP 147* 162* 139* 138* 139* 176*    Imaging Ct Head Wo Contrast  Result Date: 12/08/2016 CLINICAL DATA:  66 y/o F; the ventricular drain placement 12/05/16. Altered level of consciousness. Recent choroidal embolization of basilar aneurysm. EXAM: CT HEAD WITHOUT CONTRAST TECHNIQUE: Contiguous axial images were obtained from the base of the skull through the vertex without intravenous contrast. COMPARISON:  12/06/2016 MRI of the head.  12/03/2016 CT of the head. FINDINGS: Brain: Interval partial dispersion of subarachnoid hemorrhage in comparison with the prior CT of the head with residual hemorrhage concentrated within the occipital horns of lateral ventricles and several foci over the convexities bilaterally. The stable position of right frontal approach ventriculostomy catheter with tip abutting septic loosening of the right foramen of [place]ro. Stable ventricle size in comparison with prior MRI of the brain. Stable lucencies within medial thalamus bilaterally corresponding to infarctions on the prior MRI the brain. No evidence for new large vascular territory infarct for interval acute intracranial hemorrhage. No herniation. Vascular: Basilar aneurysm coil mass produces susceptibility artifact partially obscuring the brain at the level of the  aneurysm. Skull: Right frontal burr hole.  Otherwise unremarkable. Sinuses/Orbits: No acute finding. Other: None. IMPRESSION: 1. Interval partial dispersion of subarachnoid hemorrhage. Residual hemorrhage predominantly in occipital horns of lateral ventricles and scattered over the convexities. 2. Stable position of right frontal approach ventriculostomy catheter. Stable ventricle size. No hydrocephalus. 3. Stable distribution of bilateral medial thalamus infarcts. Infarction in upper midbrain is partially obscured by the basilar artery coil mass. 4. No new acute intracranial hemorrhage or infarct identified. Electronically Signed   By: Mitzi Hansen M.D.   On: 12/08/2016 20:04     STUDIES:  MRI BRAIN W/O 9/8: IMPRESSION: 1. Status post coil embolization of basilar artery aneurysm. 2. 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 3. Subarachnoid hemorrhage and intraventricular blood products. RIGHT frontal ventriculostomy catheter without hydrocephalus. 4. Punctate parietal infarcts versus susceptibility artifact from subarachnoid hemorrhage. 5. Moderate chronic small vessel ischemic disease. Old small cerebellar infarcts. PORT CXR 9/8:  Personally reviewed by me. No focal opacity. Enteric feeding tube coursing below diaphragm. No pleural effusion appreciated.  CT HEAD W/O 9/10: IMPRESSION: 1. Interval partial dispersion of subarachnoid hemorrhage. Residual hemorrhage predominantly in occipital horns of lateral ventricles and scattered over the convexities. 2. Stable position of right frontal approach ventriculostomy catheter. Stable ventricle size. No hydrocephalus. 3. Stable distribution of bilateral medial thalamus infarcts. Infarction in upper midbrain is partially obscured by the basilar artery coil mass. 4. No new acute intracranial hemorrhage or infarct identified.  MICROBIOLOGY: MRSA PCR 9/5:  Negative Blood Cultures x2 9/11 >>> Urine  Culture 9/11 >>> Tracheal Aspirate Culture 9/11 >>>  ANTIBIOTICS: Vancomycin 9/11 >>> Zosyn 9/11 >>>  SIGNIFICANT EVENTS: 09/05 - Admit & coiling 09/11 - PCCM consult for worsening hypoxia & persistent altered mentation >> intubated  LINES/TUBES: OETT 7.5 9/11 >>> NGT >>> PIV  DISCUSSION:  66 y.o. female with minimal past medical history status post coiling and postprocedure care complicated by CVA. Altered mentation is likely resulted in some airway compromise and probable aspiration. Given patient's altered mental status and inability to protect airway as well as worsening hypoxia she requires emergent endotracheal intubation.  ASSESSMENT / PLAN:  PULMONARY A: Acute hypoxic respiratory: Suspect secondary to aspiration.  P:   Emergent endotracheal intubation Postintubation portable chest x-ray and ABG pending  CARDIOVASCULAR A:  No acute issues.  P:  Vitals per unit protocol Continuous telemetry monitoring  RENAL A:   No acute issues.  P:   Checking electrolytes and renal function stat  GASTROINTESTINAL A:   Possible diarrhea.  P:   Holding on tube feedings for now May need C. difficile testing Checking LFTs  HEMATOLOGIC A:   Thrombocytopenia: Last CBC 9/8.  P:  Stat CBC Trending cell counts daily with CBC  INFECTIOUS A:   Possible sepsis Possible aspiration pneumonia versus pneumonitis  P:   Stat cultures Trending Procalcitonin algorithm Starting empiric vancomycin and Zosyn with pharmacy dosing  ENDOCRINE A:   Hyperglycemia:  No h/o DM.    P:   Accu-Cheks every 4 hours with M.D. notification parameters.  NEUROLOGIC A:   Acute Encephalopathy S/P Coiling Acute CVA  P:   RASS goal: 0 to -1 Versed IV prn sedation Fentanyl IV prn pain Further care per Primary Service  Prophylaxis:  SCDs. Starting Protonix IV daily.  Diet:  NPO. Holding tube feedings. Code Status:  Full Code per previous physician discussions. Disposition:   Remains critically ill in the ICU. Family Update:  Son & Husband updated at bedside prior to intubation.  I have personally spent a total of 32 minutes of critical care time today caring for the patient, updating family at bedside, & reviewing the patient's electronic medical record.  Donna ChristenJennings E. Jamison NeighborNestor, M.D. [facility] Pager:  [phone] After 3pm or if no response, call [phone] 12/09/2016, 9:13 AM

## 2016-12-09 NOTE — Progress Notes (Signed)
eLink Physician-Brief Progress Note Patient Name: Shelby Ayala Drillingaula H Marty DOB: 06/28/1950 MRN: [id number]   Date of Service  12/09/2016  HPI/Events of Note  WBC 1.2, Neutrophil count 400  eICU Interventions  Neutropenic precautions     Intervention Category Evaluation Type: Other  Erin FullingKurian Eulamae Greenstein 12/09/2016, 3:34 PM

## 2016-12-09 NOTE — Progress Notes (Signed)
Assisted with PT intubation- resulted in 7.5 ETT secured at 22 cm lip, BBS, positive End Tidal.

## 2016-12-10 ENCOUNTER — Inpatient Hospital Stay (HOSPITAL_COMMUNITY): Payer: Medicare Other

## 2016-12-10 DIAGNOSIS — R0602 Shortness of breath: Secondary | ICD-10-CM

## 2016-12-10 DIAGNOSIS — I609 Nontraumatic subarachnoid hemorrhage, unspecified: Secondary | ICD-10-CM

## 2016-12-10 DIAGNOSIS — Z978 Presence of other specified devices: Secondary | ICD-10-CM

## 2016-12-10 LAB — GLUCOSE, CAPILLARY
GLUCOSE-CAPILLARY: 195 mg/dL — AB (ref 65–99)
GLUCOSE-CAPILLARY: 187 mg/dL — AB (ref 65–99)
GLUCOSE-CAPILLARY: 146 mg/dL — AB (ref 65–99)
GLUCOSE-CAPILLARY: 174 mg/dL — AB (ref 65–99)
Glucose-Capillary: 162 mg/dL — ABNORMAL HIGH (ref 65–99)
Glucose-Capillary: 195 mg/dL — ABNORMAL HIGH (ref 65–99)

## 2016-12-10 LAB — RENAL FUNCTION PANEL
ALBUMIN: 2.4 g/dL — AB (ref 3.5–5.0)
Anion gap: 6 (ref 5–15)
BUN: 16 mg/dL (ref 6–20)
CO2: 24 mmol/L (ref 22–32)
Calcium: 8.6 mg/dL — ABNORMAL LOW (ref 8.9–10.3)
Chloride: 117 mmol/L — ABNORMAL HIGH (ref 101–111)
Creatinine, Ser: 0.62 mg/dL (ref 0.44–1.00)
GFR calc Af Amer: >60 mL/min (ref >60)
Glucose, Bld: 192 mg/dL — ABNORMAL HIGH (ref 65–99)
PHOSPHORUS: 2.8 mg/dL (ref 2.5–4.6)
POTASSIUM: 4.2 mmol/L (ref 3.5–5.1)
Sodium: 147 mmol/L — ABNORMAL HIGH (ref 135–145)

## 2016-12-10 LAB — CBC WITH DIFFERENTIAL/PLATELET
BASOS PCT: 0 %
Basophils Absolute: 0 10*3/uL (ref 0.0–0.1)
EOS ABS: 0 10*3/uL (ref 0.0–0.7)
Eosinophils Relative: 0 %
HEMATOCRIT: 36.7 % (ref 36.0–46.0)
HEMOGLOBIN: 11.5 g/dL — AB (ref 12.0–15.0)
LYMPHS ABS: 0.7 10*3/uL (ref 0.7–4.0)
Lymphocytes Relative: 7 %
MCH: 30.4 pg (ref 26.0–34.0)
MCHC: 31.3 g/dL (ref 30.0–36.0)
MCV: 97.1 fL (ref 78.0–100.0)
MONO ABS: 1 10*3/uL (ref 0.1–1.0)
Monocytes Relative: 11 %
NEUTROS ABS: 7.7 10*3/uL (ref 1.7–7.7)
Neutrophils Relative %: 82 %
Platelets: 140 10*3/uL — ABNORMAL LOW (ref 150–400)
RBC: 3.78 MIL/uL — ABNORMAL LOW (ref 3.87–5.11)
RDW: 14.4 % (ref 11.5–15.5)
WBC MORPHOLOGY: INCREASED
WBC: 9.4 10*3/uL (ref 4.0–10.5)

## 2016-12-10 LAB — PATHOLOGIST SMEAR REVIEW

## 2016-12-10 LAB — PROCALCITONIN: PROCALCITONIN: 31.94 ng/mL

## 2016-12-10 LAB — MAGNESIUM: Magnesium: 2.4 mg/dL (ref 1.7–2.4)

## 2016-12-10 MED ORDER — SODIUM CHLORIDE 0.9 % IV SOLN: INTRAVENOUS | Status: DC | PRN

## 2016-12-10 MED ORDER — INSULIN ASPART 100 UNIT/ML ~~LOC~~ SOLN
0.0000 [IU] | SUBCUTANEOUS | Status: DC
  Administered 2016-12-10: 1 [IU] via SUBCUTANEOUS
  Administered 2016-12-10 – 2016-12-11 (×4): 2 [IU] via SUBCUTANEOUS
  Administered 2016-12-11: 3 [IU] via SUBCUTANEOUS

## 2016-12-10 MED ORDER — SODIUM CHLORIDE 0.9 % IV BOLUS (SEPSIS)
500.0000 mL | Freq: Once | INTRAVENOUS | Status: AC
  Administered 2016-12-10: 500 mL via INTRAVENOUS

## 2016-12-10 MED ORDER — GERHARDT'S BUTT CREAM
TOPICAL_CREAM | Freq: Every day | CUTANEOUS | Status: DC
  Administered 2016-12-10: 1 via TOPICAL
  Administered 2016-12-11: 11:00:00 via TOPICAL
  Administered 2016-12-12 (×2): 1 via TOPICAL
  Administered 2016-12-13 – 2016-12-17 (×4): via TOPICAL
  Filled 2016-12-10: qty 1

## 2016-12-10 MED ORDER — SODIUM CHLORIDE 0.9 % IV SOLN
0.0000 ug/min | INTRAVENOUS | Status: DC
  Administered 2016-12-10: 160 ug/min via INTRAVENOUS
  Administered 2016-12-11 (×3): 300 ug/min via INTRAVENOUS
  Administered 2016-12-11: 280 ug/min via INTRAVENOUS
  Filled 2016-12-10 (×5): qty 4

## 2016-12-10 MED ORDER — PRAVASTATIN SODIUM 40 MG PO TABS
80.0000 mg | ORAL_TABLET | Freq: Every day | ORAL | Status: DC
  Administered 2016-12-10 – 2016-12-17 (×8): 80 mg via ORAL
  Filled 2016-12-10 (×8): qty 2

## 2016-12-10 MED ORDER — SODIUM CHLORIDE 0.9 % IV SOLN
INTRAVENOUS | Status: DC
  Administered 2016-12-10 – 2016-12-13 (×8): via INTRAVENOUS
  Administered 2016-12-14: 1000 mL via INTRAVENOUS
  Administered 2016-12-15 – 2016-12-16 (×2): via INTRAVENOUS
  Administered 2016-12-17: 1000 mL via INTRAVENOUS
  Administered 2016-12-17: 20:00:00 via INTRAVENOUS

## 2016-12-10 NOTE — Progress Notes (Addendum)
PULMONARY / CRITICAL CARE MEDICINE   Name: Shelby Ayala MRN: [id number] DOB: 1950-08-28    ADMISSION DATE:  12/03/2016 CONSULTATION DATE:  12/09/2016  REFERRING MD:  Conchita Paris / Neurosurgery  CHIEF COMPLAINT:  Altered Mental Status  Brief patient summary:  66 y.o. female status post coiling. Patient postoperatively had a cerebral infarct. Intraventricular drain in place.  emergent endotracheal intubation. Patient also notably febrile this morning. Further history cannot be obtained due to altered mental status.  SUBJECTIVE:  +UTI Per RN, SBP borderline low, MAPs maintained No sedation today Weaning PS 8/5   VITAL SIGNS: BP 112/65   Pulse (!) 101   Temp 100.1 F (37.8 C) (Axillary) Comment: PRN given  Resp (!) 21   Ht  (1.702 m)   Wt 142 lb 13.7 oz (64.8 kg)   SpO2 98%   BMI 22.37 kg/m   HEMODYNAMICS:    VENTILATOR SETTINGS: Vent Mode: CPAP;PSV FiO2 (%):  [40 %-100 %] 40 % Set Rate:  [18 bmp] 18 bmp Vt Set:  [490 mL] 490 mL PEEP:  [5 cmH20] 5 cmH20 Pressure Support:  [8 cmH20] 8 cmH20 Plateau Pressure:  [13 cmH20-17 cmH20] 13 cmH20  INTAKE / OUTPUT: I/O last 3 completed shifts: In: 6455 [I.V.:4625; NG/GT:1250; IV Piggyback:580] Out: 3344 [Urine:3200; Drains:144]  PHYSICAL EXAMINATION: General:  Eyes closed. Husband at bedside. No distress.  Integument:  Warm. Dry. Extremities:  No edema HEENT:  ETT/ OGT.  No scleral icterus. Pupils 5/symmetric, no reaction noted.  Intraventricular drain in place. Cardiovascular:  Regular rhythm. No JVD appreciated.  Pulmonary:  Coarse breath sounds bilaterally on MV, even/non-labored, slight decrease R base.  Abdomen: Soft. + bowel sounds. Nondistended.  Neurological:  Withdrawal to pain in LLE, otherwise unresponsive.  LABS:  BMET  Recent Labs Lab 12/09/16 0227 12/09/16 1324 12/10/16 0226  NA 144 146* 147*  K 4.1 3.2* 4.2  CL 111 114* 117*  CO2 BUN 18 21* 16  CREATININE 0.52 0.55 0.62  GLUCOSE  128* 135* 192*    Electrolytes  Recent Labs Lab 12/09/16 0227 12/09/16 1324 12/10/16 0226  CALCIUM 8.6* 8.6* 8.6*  MG  --  1.8 2.4  PHOS  --  2.1* 2.8    CBC  Recent Labs Lab 12/06/16 0947 12/09/16 1324 12/10/16 0226  WBC 11.3* 1.2* 9.4  HGB 12.8 12.2 11.5*  HCT 37.6 37.6 36.7  PLT 145* 133* 140*    Coag's  Recent Labs Lab 12/03/16 1047 12/03/16 1436  APTT 25 24  INR 0.91 0.91    Sepsis Markers  Recent Labs Lab 12/09/16 1324 12/09/16 1907 12/09/16 2212 12/10/16 0226  LATICACIDVEN 1.9 1.5 2.1*  --   PROCALCITON 0.31  --   --  31.94    ABG No results for input(s): PHART, PCO2ART, PO2ART in the last 168 hours.  Liver Enzymes  Recent Labs Lab 12/03/16 1047 12/09/16 1324 12/10/16 0226  AST 36 24  --   ALT 27 19  --   ALKPHOS 88 55  --   BILITOT 1.0 1.0  --   ALBUMIN 4.1 2.6* 2.4*    Cardiac Enzymes No results for input(s): TROPONINI, PROBNP in the last 168 hours.  Glucose  Recent Labs Lab 12/09/16 1213 12/09/16 1541 12/09/16 1951 12/09/16 2329 12/10/16 0326 12/10/16 0741  GLUCAP 84 123* 160* 198* 195* 187*    Imaging Dg Chest Port 1 View  Result Date: 12/09/2016 CLINICAL DATA:  Intubation, followup EXAM: PORTABLE CHEST 1 VIEW COMPARISON:  Chest x-ray of 12/06/2016 FINDINGS: The tip of the endotracheal tube is approximately 4.9 cm above the carina. Minimal right basilar linear atelectasis is present. No pneumonia or effusion is seen. Mediastinal and hilar contours are unremarkable. The heart is within normal limits in size. NG tube has been removed. IMPRESSION: 1. Tip of endotracheal tube 4.9 cm above the carina. 2. Mild right basilar linear atelectasis. No definite pneumonia or effusion. Electronically Signed   By: Dwyane DeePaul  Barry M.D.   On: 12/09/2016 10:46   Dg Abd Portable 1v  Result Date: 12/09/2016 CLINICAL DATA:  OG tube placement. EXAM: PORTABLE ABDOMEN - 1 VIEW COMPARISON:  Radiograph dated 12/04/2016 FINDINGS: OG tube has been  inserted. The tip is in the distal stomach. Bowel gas pattern is normal. Lung bases are clear. IMPRESSION: OG tube tip in the distal stomach. Electronically Signed   By: Francene BoyersJames  Maxwell M.D.   On: 12/09/2016 11:35    STUDIES:  MRI BRAIN W/O 9/8: IMPRESSION: 1. Status post coil embolization of basilar artery aneurysm. 2. 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 3. Subarachnoid hemorrhage and intraventricular blood products. RIGHT frontal ventriculostomy catheter without hydrocephalus. 4. Punctate parietal infarcts versus susceptibility artifact from subarachnoid hemorrhage. 5. Moderate chronic small vessel ischemic disease. Old small cerebellar infarcts. PORT CXR 9/8:  Personally reviewed by me. No focal opacity. Enteric feeding tube coursing below diaphragm. No pleural effusion appreciated. CT HEAD W/O 9/10: IMPRESSION: 1. Interval partial dispersion of subarachnoid hemorrhage. Residual hemorrhage predominantly in occipital horns of lateral ventricles and scattered over the convexities. 2. Stable position of right frontal approach ventriculostomy catheter. Stable ventricle size. No hydrocephalus. 3. Stable distribution of bilateral medial thalamus infarcts. Infarction in upper midbrain is partially obscured by the basilar artery coil mass. 4. No new acute intracranial hemorrhage or infarct identified.  MICROBIOLOGY: MRSA PCR 9/5:  Negative Blood Cultures x2 9/11 >>> Urine Culture 9/11 >>> Tracheal Aspirate Culture 9/11 >>>  ANTIBIOTICS: Vancomycin 9/11 >>>9/12 Zosyn 9/11 >>>  SIGNIFICANT EVENTS: 09/05 - Admit & coiling 09/11 - PCCM consult for worsening hypoxia & persistent altered mentation >> intubated  LINES/TUBES: OETT 7.5 9/11 >>> NGT >>> PIV  DISCUSSION:  66 y.o. female with minimal past medical history status post coiling and postprocedure care complicated by CVA. Altered mentation is likely resulted in some airway compromise  and probable aspiration. Given patient's altered mental status and inability to protect airway as well as worsening hypoxia she required intubation 9/11.   ASSESSMENT / PLAN:  PULMONARY A: Acute hypoxic respiratory: Suspect secondary to aspiration.  P:   Continue PRVC 8 cc/kg  Wean PEEP/ FiO2 for sats > 92% CXR in am VAP protocol See ID Daily SBT, hold extubation till neuro improves  CARDIOVASCULAR A:  Hypotension- improving  P:  Tele monitoring  Goal map 65-70; SBP 140-160 to maintain CPP Neo prn Will place Aline and CVL w/ CVP monitoring   RENAL A:   Hypernatremia/hyperchloremia Lactic acidosis   P:   NS w/ KCL 20 meq at 125 ml/hr Trend BMET  Strict I&Os, place foley Replace electrolytes as indicated  GASTROINTESTINAL A:   Possible diarrhea- last occurrence 9/11 x 2   P:   Resume tube feedings Consider C. difficile testing if diarrhea returns LFTs ok   HEMATOLOGIC A:   Thrombocytopenia: Last CBC 9/8- improved   P:  Trend CBC SCD for VTE ppx  INFECTIOUS A:   Possible sepsis Possible aspiration pneumonia versus pneumonitis UTI P:   Follow cultures  Trending Procalcitonin algorithm Continue empiric vancomycin and Zosyn with pharmacy dosing  ENDOCRINE A:   Hyperglycemia:  No h/o DM.    P:   Accu-Cheks every 4 hours  Start SSI  NEUROLOGIC A:   Acute Encephalopathy SAH S/P Coiling and EVD Acute CVA- small thalamic and midbrain  P:   RASS goal: 0 to -1 Versed IV prn sedation Fentanyl IV prn pain EVD Management/ TCDs per NSGY, may need triple H therapy Continue keppra, decadron, nimodipine per NGSY   Prophylaxis:  SCDs. Starting Protonix IV daily.  Diet:  NPO. restart tube feedings. Code Status:  Full Code per previous physician discussions. Disposition:  Remains critically ill in the ICU. Family Update:  Husband updated at bedside.   CCT 40 mins  Posey Boyer, AGACNP-BC [facility] Pgr: [phone] or if no  answer [phone] 12/10/2016, 8:57 AM   STAFF NOTE: I, Rory Percy, MD FACP have personally reviewed patient's available data, including medical history, events of note, physical examination and test results as part of my evaluation. I have discussed with resident/NP and other care providers such as pharmacist, RN and RRT. In addition, I personally evaluated patient and elicited key findings of: frass -4, not fc, per 5 nonreactive, ett, flicker to pain, cta lungs, abdo soft, no rash no edema, pcxr which I reviewed shows mild int changes, ett wnl, no well defined infiltrate, CT I reviewed shows SAh, MRi with sah, she presents with SAH, coil, fast onset ischemic strokes , no true vasospasm noted but some clinical concerns, need repeat TCD, map was low overnight, should consider MAP 65-70, place line, assess cvp, avoid free water, allow Na, neo to these goals, consider addition statin if LFt wnl, with current neuro status will discuss with NS to repeat CT  extention of cva?, follow urine output closely, UA not impressive for me, a culture was sent because of nitrates, she is from home originally, MRSA neg, dc vanc, maintain zosyn for now, keep pos balance, may need bolus, I updated family in full The patient is critically ill with multiple organ systems failure and requires high complexity decision making for assessment and support, frequent evaluation and titration of therapies, application of advanced monitoring technologies and extensive interpretation of multiple databases.   Critical Care Time devoted to patient care services described in this note is 35 Minutes. This time reflects time of care of this signee: Rory Percy, MD FACP. This critical care time does not reflect procedure time, or teaching time or supervisory time of PA/NP/Med student/Med Resident etc but could involve care discussion time. Rest per NP/medical resident whose note is outlined above and that I agree with   Mcarthur Rossetti.  Tyson Alias, MD, FACP Pgr: [phone] [place] [facility] 12/10/2016 9:44 AM

## 2016-12-10 NOTE — Procedures (Signed)
Central Venous Catheter Insertion Procedure Note Shelby Ayala [id number] 12/01/1950  Procedure: Insertion of Central Venous Catheter Indications: Assessment of intravascular volume, Drug and/or fluid administration and Frequent blood sampling  Procedure Details Consent: Risks of procedure as well as the alternatives and risks of each were explained to the (patient/caregiver).  Consent for procedure obtained. Time Out: Verified patient identification, verified procedure, site/side was marked, verified correct patient position, special equipment/implants available, medications/allergies/relevent history reviewed, required imaging and test results available.  Performed  Maximum sterile technique was used including antiseptics, cap, gloves, gown, hand hygiene, mask and sheet. Skin prep: Chlorhexidine; local anesthetic administered A antimicrobial bonded/coated triple lumen catheter was placed in the right internal jugular vein using the Seldinger technique.  Evaluation Blood flow good Complications: No apparent complications Patient did tolerate procedure well. Chest X-ray ordered to verify placement.  CXR: pending.  Nelda BucksFEINSTEIN,DANIEL J. 12/10/2016, 10:51 AM  US Mcarthur Rossettianiel J. Tyson AliasFeinstein, MD, FACP Pgr: [phone] [place] [facility]

## 2016-12-10 NOTE — Progress Notes (Signed)
   ekg ordered earlier due to ? Respiratory pattern on monitor  ekg - is NSR Asked RN to rest patient on vent  Dr. Kalman ShanMurali Abyan Cadman, M.D., [facility] Critical Care Medicine Staff Physician [facility] Pager: [phone], If no answer or between  15:00h - 7:00h: call [phone]  12/10/2016 10:51 PM

## 2016-12-10 NOTE — Progress Notes (Signed)
Transcranial Doppler  Date POD PCO2 HCT BP  MCA ACA PCA OPHT SIPH VERT Basilar  9/6/rs     Right  Left   45  47   -38  -30   18  25   18  18    42  31   -18  -23   -35      9/7, rds     Right  Left   45  43   -49  -25   27  27   23  17    38  44   -21  -20   *      9/10 rs,vs     Right  Left   57  57   -55  -46   31  *   21  18   37  42   -34  -32   -56      9/12 cb     Right  Left   43  51   -28  -33   21  11   18  18   26  28    *  47   19            Right  Left                                            Right  Left                                            Right  Left                                        MCA = Middle Cerebral Artery      OPHT = Opthalmic Artery     BASILAR = Basilar Artery   ACA = Anterior Cerebral Artery     SIPH = Carotid Siphon PCA = Posterior Cerebral Artery   VERT = Verterbral Artery                   Normal MCA = 62+\-12 ACA = 50+\-12 PCA = 42+\-23   9/7 - (*) not insonated. rds 9/10 - (*) not insonated. rds and vs. 9/12- (*) not insonated, cb Levin BaconClaire Soffia Doshier- RDMS, RVT 4:48 PM  12/10/2016

## 2016-12-10 NOTE — Progress Notes (Signed)
Orthopedic Tech Progress Note Patient Details:  Shelby Ayala 08/12/1950 [id number]  Ortho Devices Type of Ortho Device: Postop shoe/boot Ortho Device/Splint Location: (B) LE prafo boots Ortho Device/Splint Interventions: Ordered, Application   Jennye MoccasinHughes, [name] 12/10/2016, 3:05 PM

## 2016-12-11 ENCOUNTER — Inpatient Hospital Stay (HOSPITAL_COMMUNITY): Payer: Medicare Other

## 2016-12-11 DIAGNOSIS — Z0189 Encounter for other specified special examinations: Secondary | ICD-10-CM

## 2016-12-11 DIAGNOSIS — I608 Other nontraumatic subarachnoid hemorrhage: Secondary | ICD-10-CM

## 2016-12-11 LAB — RENAL FUNCTION PANEL
Albumin: 2.2 g/dL — ABNORMAL LOW (ref 3.5–5.0)
Anion gap: 6 (ref 5–15)
BUN: 18 mg/dL (ref 6–20)
CHLORIDE: 116 mmol/L — AB (ref 101–111)
CO2: 24 mmol/L (ref 22–32)
CREATININE: 0.49 mg/dL (ref 0.44–1.00)
Calcium: 8.7 mg/dL — ABNORMAL LOW (ref 8.9–10.3)
GFR calc Af Amer: >60 mL/min (ref >60)
Glucose, Bld: 191 mg/dL — ABNORMAL HIGH (ref 65–99)
Phosphorus: 1.7 mg/dL — ABNORMAL LOW (ref 2.5–4.6)
Potassium: 3 mmol/L — ABNORMAL LOW (ref 3.5–5.1)
Sodium: 146 mmol/L — ABNORMAL HIGH (ref 135–145)

## 2016-12-11 LAB — GLUCOSE, CAPILLARY
GLUCOSE-CAPILLARY: 160 mg/dL — AB (ref 65–99)
GLUCOSE-CAPILLARY: 182 mg/dL — AB (ref 65–99)
Glucose-Capillary: 149 mg/dL — ABNORMAL HIGH (ref 65–99)
Glucose-Capillary: 166 mg/dL — ABNORMAL HIGH (ref 65–99)
Glucose-Capillary: 180 mg/dL — ABNORMAL HIGH (ref 65–99)
Glucose-Capillary: 212 mg/dL — ABNORMAL HIGH (ref 65–99)

## 2016-12-11 LAB — CBC WITH DIFFERENTIAL/PLATELET
BASOS PCT: 0 %
Basophils Absolute: 0 10*3/uL (ref 0.0–0.1)
EOS PCT: 0 %
Eosinophils Absolute: 0 10*3/uL (ref 0.0–0.7)
HEMATOCRIT: 33.4 % — AB (ref 36.0–46.0)
Hemoglobin: 10.7 g/dL — ABNORMAL LOW (ref 12.0–15.0)
Lymphocytes Relative: 3 %
Lymphs Abs: 0.7 10*3/uL (ref 0.7–4.0)
MCH: 30.9 pg (ref 26.0–34.0)
MCHC: 32 g/dL (ref 30.0–36.0)
MCV: 96.5 fL (ref 78.0–100.0)
MONO ABS: 2.1 10*3/uL — AB (ref 0.1–1.0)
MONOS PCT: 9 %
NEUTROS ABS: 20 10*3/uL — AB (ref 1.7–7.7)
Neutrophils Relative %: 88 %
PLATELETS: 243 10*3/uL (ref 150–400)
RBC: 3.46 MIL/uL — AB (ref 3.87–5.11)
RDW: 14.4 % (ref 11.5–15.5)
WBC Morphology: INCREASED
WBC: 22.8 10*3/uL — AB (ref 4.0–10.5)

## 2016-12-11 LAB — MAGNESIUM: Magnesium: 2 mg/dL (ref 1.7–2.4)

## 2016-12-11 LAB — CULTURE, RESPIRATORY W GRAM STAIN

## 2016-12-11 LAB — CULTURE, RESPIRATORY: SPECIAL REQUESTS: NORMAL

## 2016-12-11 LAB — PROCALCITONIN: Procalcitonin: 16.64 ng/mL

## 2016-12-11 MED ORDER — NOREPINEPHRINE 4 MG/250ML-% IV SOLN
0.0000 ug/min | INTRAVENOUS | Status: DC
  Administered 2016-12-11: 5 ug/min via INTRAVENOUS
  Filled 2016-12-11: qty 250

## 2016-12-11 MED ORDER — CHLORHEXIDINE GLUCONATE CLOTH 2 % EX PADS
6.0000 | MEDICATED_PAD | Freq: Every day | CUTANEOUS | Status: DC
  Administered 2016-12-12 – 2016-12-13 (×3): 6 via TOPICAL

## 2016-12-11 MED ORDER — POTASSIUM PHOSPHATES 15 MMOLE/5ML IV SOLN
30.0000 mmol | Freq: Once | INTRAVENOUS | Status: AC
  Administered 2016-12-11: 30 mmol via INTRAVENOUS
  Filled 2016-12-11: qty 10

## 2016-12-11 MED ORDER — DEXTROSE 5 % IV SOLN
2.0000 g | INTRAVENOUS | Status: DC
  Administered 2016-12-12 – 2016-12-17 (×6): 2 g via INTRAVENOUS
  Filled 2016-12-11 (×7): qty 2

## 2016-12-11 MED ORDER — CEFTRIAXONE SODIUM 1 G IJ SOLR
1.0000 g | INTRAMUSCULAR | Status: DC
  Administered 2016-12-11: 1 g via INTRAVENOUS
  Filled 2016-12-11: qty 10

## 2016-12-11 MED ORDER — INSULIN ASPART 100 UNIT/ML ~~LOC~~ SOLN
0.0000 [IU] | SUBCUTANEOUS | Status: DC
  Administered 2016-12-11 – 2016-12-12 (×5): 3 [IU] via SUBCUTANEOUS
  Administered 2016-12-12 (×2): 2 [IU] via SUBCUTANEOUS
  Administered 2016-12-12: 3 [IU] via SUBCUTANEOUS
  Administered 2016-12-12: 2 [IU] via SUBCUTANEOUS
  Administered 2016-12-13: 3 [IU] via SUBCUTANEOUS
  Administered 2016-12-13 – 2016-12-14 (×6): 2 [IU] via SUBCUTANEOUS
  Administered 2016-12-14 (×2): 3 [IU] via SUBCUTANEOUS
  Administered 2016-12-14 – 2016-12-15 (×9): 2 [IU] via SUBCUTANEOUS
  Administered 2016-12-16: 3 [IU] via SUBCUTANEOUS
  Administered 2016-12-16 – 2016-12-18 (×10): 2 [IU] via SUBCUTANEOUS
  Administered 2016-12-18: 3 [IU] via SUBCUTANEOUS

## 2016-12-11 MED ORDER — POTASSIUM CHLORIDE 20 MEQ/15ML (10%) PO SOLN
40.0000 meq | Freq: Once | ORAL | Status: AC
  Administered 2016-12-11: 40 meq
  Filled 2016-12-11: qty 30

## 2016-12-11 MED ORDER — SODIUM CHLORIDE 0.9% FLUSH
10.0000 mL | INTRAVENOUS | Status: DC | PRN
  Administered 2016-12-13: 10 mL
  Filled 2016-12-11: qty 40

## 2016-12-11 NOTE — Progress Notes (Signed)
Pt seen and examined. No issues overnight. EEG done today  EXAM: Temp:  [97.7 F (36.5 C)-100.3 F (37.9 C)] 100.3 F (37.9 C) (09/13 1142) Pulse Rate:  [63-94] 63 (09/13 1119) Resp:  [17-23] 21 (09/13 1200) BP: (96-167)/(51-86) 138/76 (09/13 1200) SpO2:  [98 %-100 %] 100 % (09/13 1200) FiO2 (%):  [40 %] 40 % (09/13 1100) Weight:  [65 kg (143 lb 4.8 oz)] 65 kg (143 lb 4.8 oz) (09/13 0402) Intake/Output      09/12 0701 - 09/13 0700 09/13 0701 - 09/14 0700   I.V. (mL/kg) 4766.6 (73.3)    NG/GT 870    IV Piggyback 100    Total Intake(mL/kg) 5736.6 (88.3)    Urine (mL/kg/hr) 2800 (1.8)    Drains 115    Total Output 2915     Net +2821.6          Stool Occurrence 1 x     Raises eyebrows with stimulation Pupils 6mm, non-reactive (-) Doll's eyes Breathing spontaneously on vent Not FC, minimal W/D LUE/BLE EVD in place, open @ 5mmHg  LABS: Lab Results  Component Value Date   CREATININE 0.49 12/11/2016   BUN 18 12/11/2016   NA 146 (H) 12/11/2016   K 3.0 (L) 12/11/2016   CL 116 (H) 12/11/2016   CO2 24 12/11/2016   Lab Results  Component Value Date   WBC 22.8 (H) 12/11/2016   HGB 10.7 (L) 12/11/2016   HCT 33.4 (L) 12/11/2016   MCV 96.5 12/11/2016   PLT 243 12/11/2016    IMAGING: TCD reviewed, not indicative of large vessel spasm  IMPRESSION: - 66 y.o. female SAH d# 9 s/p coiling basilar aneurysm. Remains unchanged neurologically, appears to be minimally conscious. EEG negative for active SZ.  PLAN: - Cont supportive care

## 2016-12-11 NOTE — Progress Notes (Signed)
Bedside EEG completed, results pending. 

## 2016-12-11 NOTE — Procedures (Signed)
ELECTROENCEPHALOGRAM REPORT  Date of Study: 12/11/2016  Patient's Name: Shelby Ayala MRN: [id number] Date of Birth: 24-Sep-1950  Referring Provider: Dr. Consuella Lose  Clinical History: This is a 66 year old woman with altered mental status.  Medications: levETIRAcetam (KEPPRA) 1,000 mg in sodium chloride 0.9 % 100 mL IVPB  acetaminophen (TYLENOL) tablet 650 mg  aspirin chewable tablet 81 mg  cefTRIAXone (ROCEPHIN) 1 g in dextrose 5 % 50 mL IVPB  chlorhexidine gluconate (MEDLINE KIT) (PERIDEX) 0.12 % solution 15 mL  cycloSPORINE (RESTASIS) 0.05 % ophthalmic emulsion 1 drop  dexamethasone (DECADRON) injection 4 mg  fentaNYL (SUBLIMAZE) injection 25-50 mcg  insulin aspart (novoLOG) injection 0-15 Units  loperamide (IMODIUM) 1 MG/5ML solution 2 mg  MEDLINE mouth rinse  midazolam (VERSED) injection 1-4 mg  niMODipine (NIMOTOP) capsule 30 mg  norepinephrine (LEVOPHED) 14m in D5W 2556mpremix infusion  ondansetron (ZOFRAN) injection 4 mg  pantoprazole (PROTONIX) injection 40 mg  potassium PHOSPHATE 30 mmol in dextrose 5 % 500 mL infusion  pravastatin (PRAVACHOL) tablet 80 mg   Technical Summary: A multichannel digital EEG recording measured by the international 10-20 system with electrodes applied with paste and impedances below 5000 ohms performed as portable with EKG monitoring in an intubated and unresponsive patient. No sedating medications listed. Hyperventilation and photic stimulation were not performed.  The digital EEG was referentially recorded, reformatted, and digitally filtered in a variety of bipolar and referential montages for optimal display.   Description: The patient is intubated and unresponsive during the recording. There is no clear posterior dominant rhythm seen. The background consists of a large amount of diffuse 4-5 Hz theta and 2-3 Hz delta slowing, with shifting asymmetry over the bilateral temporal regions, at times sharply contoured without clear  epileptogenic potential. With stimulation when patient coughs, there is a change in background with increase in polymorphic delta slowing. Normal sleep architecture is not seen. Hyperventilation and photic stimulation were not performed. There were no epileptiform discharges or electrographic seizures seen.    EKG lead was unremarkable.  Impression: This EEG is abnormal due to moderate diffuse slowing of the background.  Clinical Correlation of the above findings indicates diffuse cerebral dysfunction that is non-specific in etiology and can be seen with hypoxic/ischemic injury, toxic/metabolic encephalopathies, or medication effect.  The absence of epileptiform discharges does not rule out a clinical diagnosis of epilepsy.  Clinical correlation is advised.   KaEllouise NewerM.D.

## 2016-12-11 NOTE — Progress Notes (Signed)
PULMONARY / CRITICAL [facility] MEDICINE   Name: Shelby Ayala MRN: [id number] DOB: 09/30/1950    ADMISSION DATE:  12/03/2016 CONSULTATION DATE:  12/09/2016  REFERRING MD:  Conchita ParisNundkumar / Neurosurgery  CHIEF COMPLAINT:  Altered Mental Status  Brief patient summary:  66 y.o. female status post coiling. Patient postoperatively had a cerebral infarct. Intraventricular drain in place.  emergent endotracheal intubation. Patient also notably febrile this morning. Further history cannot be obtained due to altered mental status.  SUBJECTIVE:  Weaned on PS 8/5 yesterday for 8 hrs, then had some ectopy, EKG - NSR Neo at 280 mcg/min for SBP goals CVP 10, +2.8L  Patient with purwick cath and intermittent loose stools mixed in.   VITAL SIGNS: BP (!) 151/80   Pulse 94   Temp 97.9 F (36.6 C) (Axillary)   Resp 19   Ht 5\' 7"  (1.702 m)   Wt 143 lb 4.8 oz (65 kg)   SpO2 100%   BMI 22.44 kg/m   HEMODYNAMICS: CVP:  [1 mmHg-13 mmHg] 13 mmHg  VENTILATOR SETTINGS: Vent Mode: PRVC FiO2 (%):  [40 %] 40 % Set Rate:  [14 bmp] 14 bmp Vt Set:  [490 mL] 490 mL PEEP:  [5 cmH20] 5 cmH20 Pressure Support:  [8 cmH20] 8 cmH20 Plateau Pressure:  [14 cmH20-15 cmH20] 14 cmH20  INTAKE / OUTPUT: I/O last 3 completed shifts: In: 7596.6 [I.V.:6266.6; NG/GT:870; IV Piggyback:460] Out: 4248 [Urine:4100; Drains:148]  PHYSICAL EXAMINATION: General:  Critically ill appearing female lying in bed in NAD HEENT: MM pink/moist, ETT/ OGT, scleral anicteric, dilated pupils/no response, Intraventricular drain in place Neuro: Flickers/withdrawls to pain in all extremities, not following commands, +cough CV:  rrr, no m/r/g PULM: even/non-labored on MV, lungs bilaterally coarse, thick tan secretions GI: soft, ND/NTnon-tender, bs active  Extremities: warm/dry, no edema  Skin: no rashes, some skin breakdown in perineum at posterior purwick site   LABS:  BMET  Recent Labs Lab 12/09/16 1324 12/10/16 0226 12/11/16 0500   NA 146* 147* 146*  K 3.2* 4.2 3.0*  CL 114* 117* 116*  CO2 22 24 24   BUN 21* 16 18  CREATININE 0.55 0.62 0.49  GLUCOSE 135* 192* 191*    Electrolytes  Recent Labs Lab 12/09/16 1324 12/10/16 0226 12/11/16 0500  CALCIUM 8.6* 8.6* 8.7*  MG 1.8 2.4 2.0  PHOS 2.1* 2.8 1.7*    CBC  Recent Labs Lab 12/09/16 1324 12/10/16 0226 12/11/16 0500  WBC 1.2* 9.4 22.8*  HGB 12.2 11.5* 10.7*  HCT 37.6 36.7 33.4*  PLT 133* 140* 243    Coag's No results for input(s): APTT, INR in the last 168 hours.  Sepsis Markers  Recent Labs Lab 12/09/16 1324 12/09/16 1907 12/09/16 2212 12/10/16 0226 12/11/16 0500  LATICACIDVEN 1.9 1.5 2.1*  --   --   PROCALCITON 0.31  --   --  31.94 16.64    ABG No results for input(s): PHART, PCO2ART, PO2ART in the last 168 hours.  Liver Enzymes  Recent Labs Lab 12/09/16 1324 12/10/16 0226 12/11/16 0500  AST 24  --   --   ALT 19  --   --   ALKPHOS 55  --   --   BILITOT 1.0  --   --   ALBUMIN 2.6* 2.4* 2.2*    Cardiac Enzymes No results for input(s): TROPONINI, PROBNP in the last 168 hours.  Glucose  Recent Labs Lab 12/10/16 1144 12/10/16 1530 12/10/16 1925 12/10/16 2322 12/11/16 0316 12/11/16 0728  GLUCAP 162* 146* 195* 174*  212* 166*    Imaging Dg Chest Port 1 View  Result Date: 12/11/2016 CLINICAL DATA:  Intubation. EXAM: PORTABLE CHEST 1 VIEW COMPARISON:  12/10/2016. FINDINGS: Endotracheal tube, NG tube, right IJ line in stable position. Heart size stable. Mild bibasilar atelectasis/ infiltrates. No pleural effusion or pneumothorax . IMPRESSION: 1. Lines and tubes in stable position. 2.  Mild bibasilar atelectasis/infiltrates. Electronically Signed   By: Maisie Fus  Register   On: 12/11/2016 07:28   Dg Chest Port 1 View  Result Date: 12/10/2016 CLINICAL DATA:  Central line placement EXAM: PORTABLE CHEST 1 VIEW COMPARISON:  12/09/2016 FINDINGS: 1121 hours. Endotracheal tube tip is 3.4 cm above the base of the carina. The NG  tube passes into the stomach although the distal tip position is not included on the film. Right IJ central line tip overlies the upper right atrium near the SVC junction. Slight increase and patchy airspace disease right base, likely atelectatic. Pulmonary vascular congestion persists. Cardiopericardial silhouette is at upper limits of normal for size. The visualized bony structures of the thorax are intact. Telemetry leads overlie the chest. IMPRESSION: Right IJ central line tip projects in the expected location of the SVC/RA junction. No evidence for pneumothorax. Electronically Signed   By: Kennith Center M.D.   On: 12/10/2016 11:39    STUDIES:  MRI BRAIN W/O 9/8: IMPRESSION: 1. Status post coil embolization of basilar artery aneurysm. 2. 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 3. Subarachnoid hemorrhage and intraventricular blood products. RIGHT frontal ventriculostomy catheter without hydrocephalus. 4. Punctate parietal infarcts versus susceptibility artifact from subarachnoid hemorrhage. 5. Moderate chronic small vessel ischemic disease. Old small cerebellar infarcts. PORT CXR 9/8:  Personally reviewed by me. No focal opacity. Enteric feeding tube coursing below diaphragm. No pleural effusion appreciated. CT HEAD W/O 9/10: IMPRESSION: 1. Interval partial dispersion of subarachnoid hemorrhage. Residual hemorrhage predominantly in occipital horns of lateral ventricles and scattered over the convexities. 2. Stable position of right frontal approach ventriculostomy catheter. Stable ventricle size. No hydrocephalus. 3. Stable distribution of bilateral medial thalamus infarcts. Infarction in upper midbrain is partially obscured by the basilar artery coil mass. 4. No new acute intracranial hemorrhage or infarct identified.  MICROBIOLOGY: MRSA PCR 9/5:  Negative Blood Cultures x2 9/11 >>> Urine Culture 9/11 >>> e.coli  -100,000 colonies Tracheal  Aspirate Culture 9/11 >> GPCs in pairs and clusters >>  ANTIBIOTICS: Vancomycin 9/11 >>>9/12; 9/13 Zosyn 9/11 >>>  SIGNIFICANT EVENTS: 09/05 - Admit & coiling 09/11 - PCCM consult for worsening hypoxia & persistent altered mentation >> intubated  LINES/TUBES: OETT 7.5 9/11 >>> NGT >>> PIV R TL IJ CVC 9/12 >>  DISCUSSION:  66 y.o. female with minimal past medical history status post coiling and postprocedure Ayala complicated by CVA. Altered mentation is likely resulted in some airway compromise and probable aspiration. Given patient's altered mental status and inability to protect airway as well as worsening hypoxia she required intubation 9/11.   ASSESSMENT / PLAN:  PULMONARY A: Acute hypoxic respiratory: Suspect secondary to aspiration. - CXR with mild bibasilar atelectasis   P:   Continue PRVC 8 cc/kg  Wean PEEP/ FiO2 for sats > 92% Trend CXR  VAP protocol See ID Daily SBT, hold extubation till neuro improves  CARDIOVASCULAR A:  Hypotension +/- component of septic shock  P:  Tele monitoring  Neo for goal SBP 140-160 to maintain CPP Continue CVP monitoring, currently 8-13 Monitor electrolytes   RENAL A:   Hypernatremia/hyperchloremia Hypokalemia Lactic acidosis   P:  KCL 40 meq x 1 NS w/ KCL 20 meq at 125 ml/hr Trend BMET / mag/ phos Strict I&Os, monitor UO Replace electrolytes as indicated  GASTROINTESTINAL A:   Possible diarrhea- ? Some stool mixed in with purwick catheter/urine, unclear of consistency/acutal amount of stool  P:   Continue tube feedings PPI for SUP Reassess stools after placement of foley, may need to send for Cdiff testing  HEMATOLOGIC A:   Thrombocytopenia- resolved Leukocytosis- likely steroid induced, PCT improving  P:  Trend CBC SCD for VTE ppx  INFECTIOUS A:   Possible sepsis Possible aspiration pneumonia versus pneumonitis UTI  P:   Follow cultures Trending Procalcitonin algorithm Continue Zosyn  Add back  vanc for staph coverage, pending final result on sputum cx  ENDOCRINE A:   Hyperglycemia:  No h/o DM.    P:   Accu-Cheks every 4 hours  Increase to mod SSI  NEUROLOGIC A:   Acute Encephalopathy SAH S/P Coiling and EVD Acute CVA- small thalamic and midbrain  P:   RASS goal: 0  Fentanyl IV prn pain EVD Management/ TCDs/ imaging per NSGY,  Continue with modified HHH therapy EEG ordered Defer imaging to NSGY, Continue keppra, decadron, nimodipine per NGSY  CCT 40 mins  Shelby Ayala, AGACNP-BC Shelby Ayala Pgr: [phone] or if no answer [phone] 12/11/2016, 8:03 AM   STAFF NOTE: I, Shelby Percy, MD FACP have personally reviewed patient's available data, including medical history, events of note, physical examination and test results as part of my evaluation. I have discussed with resident/NP and other [facility] providers such as pharmacist, RN and RRT. In addition, I personally evaluated patient and elicited key findings of: agitated at times, coughing, NOT fc, lungs coarse bases, abdo soft, no rash, line clean, pcxr which I reviewed shows bibasilar atx / infiltrates unchanged, her neuro status remains poor, consider imaging, per NS, eeg today to rule out subclinical seizures, maintain keppra 21 days likely regardless of result if present with seizure would need escalation of therapy seizures, she has UA pos and E coli 100K which is concerning from stool, her purewhick is NOT helping, it is harboring stool in it and causing break down, needs foley now, staph noted also need to treat, will use CTX for 8 days total for both concerning infections, weaning CPAP 5 PS 5, goal 2 hours, no extubation planned, steroids consider lower, per NS, cvp noted yesterday after placement line was 1-3, volume provided now cvp 12, can reduce to 75 cc/kh,. Neo is max 200, change to levophed, I updated family in room, TCD again unimpressive 12th The patient is critically ill with multiple  organ systems failure and requires high complexity decision making for assessment and support, frequent evaluation and titration of therapies, application of advanced monitoring technologies and extensive interpretation of multiple databases.   Critical Ayala Time devoted to patient Ayala services described in this note is30 Minutes. This time reflects time of Ayala of this signee: Shelby Percy, MD FACP. This critical Ayala time does not reflect procedure time, or teaching time or supervisory time of PA/NP/Med student/Med Resident etc but could involve Ayala discussion time. Rest per NP/medical resident whose note is outlined above and that I agree with   Shelby Ayala. Tyson Alias, MD, FACP Pgr: [phone] Shelby [facility] Pulmonary & Critical [facility] 12/11/2016 10:25 AM

## 2016-12-11 NOTE — Progress Notes (Signed)
MD Conchita ParisNundkumar paged re: pupils appear > 5mm to me, need order clarification on height of EVD asa well as MAP vs SBP goal.

## 2016-12-12 ENCOUNTER — Inpatient Hospital Stay (HOSPITAL_COMMUNITY): Payer: Medicare Other

## 2016-12-12 DIAGNOSIS — I609 Nontraumatic subarachnoid hemorrhage, unspecified: Secondary | ICD-10-CM

## 2016-12-12 DIAGNOSIS — J9600 Acute respiratory failure, unspecified whether with hypoxia or hypercapnia: Secondary | ICD-10-CM

## 2016-12-12 LAB — URINE CULTURE: SPECIAL REQUESTS: NORMAL

## 2016-12-12 LAB — GLUCOSE, CAPILLARY
GLUCOSE-CAPILLARY: 141 mg/dL — AB (ref 65–99)
GLUCOSE-CAPILLARY: 163 mg/dL — AB (ref 65–99)
Glucose-Capillary: 111 mg/dL — ABNORMAL HIGH (ref 65–99)
Glucose-Capillary: 126 mg/dL — ABNORMAL HIGH (ref 65–99)
Glucose-Capillary: 140 mg/dL — ABNORMAL HIGH (ref 65–99)
Glucose-Capillary: 155 mg/dL — ABNORMAL HIGH (ref 65–99)

## 2016-12-12 LAB — CBC WITH DIFFERENTIAL/PLATELET
Basophils Absolute: 0 10*3/uL (ref 0.0–0.1)
Basophils Relative: 0 %
Eosinophils Absolute: 0 10*3/uL (ref 0.0–0.7)
Eosinophils Relative: 0 %
HEMATOCRIT: 32.8 % — AB (ref 36.0–46.0)
HEMOGLOBIN: 10.4 g/dL — AB (ref 12.0–15.0)
LYMPHS ABS: 1.4 10*3/uL (ref 0.7–4.0)
Lymphocytes Relative: 11 %
MCH: 30.3 pg (ref 26.0–34.0)
MCHC: 31.7 g/dL (ref 30.0–36.0)
MCV: 95.6 fL (ref 78.0–100.0)
MONO ABS: 1.1 10*3/uL — AB (ref 0.1–1.0)
MONOS PCT: 9 %
NEUTROS ABS: 9.6 10*3/uL — AB (ref 1.7–7.7)
NEUTROS PCT: 80 %
Platelets: 172 10*3/uL (ref 150–400)
RBC: 3.43 MIL/uL — ABNORMAL LOW (ref 3.87–5.11)
RDW: 14.4 % (ref 11.5–15.5)
WBC: 12 10*3/uL — ABNORMAL HIGH (ref 4.0–10.5)

## 2016-12-12 LAB — RENAL FUNCTION PANEL
ANION GAP: 6 (ref 5–15)
Albumin: 2.1 g/dL — ABNORMAL LOW (ref 3.5–5.0)
BUN: 18 mg/dL (ref 6–20)
CHLORIDE: 110 mmol/L (ref 101–111)
CO2: 26 mmol/L (ref 22–32)
CREATININE: 0.49 mg/dL (ref 0.44–1.00)
Calcium: 8.7 mg/dL — ABNORMAL LOW (ref 8.9–10.3)
GFR calc non Af Amer: >60 mL/min (ref >60)
Glucose, Bld: 150 mg/dL — ABNORMAL HIGH (ref 65–99)
POTASSIUM: 3.5 mmol/L (ref 3.5–5.1)
Phosphorus: 2.5 mg/dL (ref 2.5–4.6)
Sodium: 142 mmol/L (ref 135–145)

## 2016-12-12 LAB — MAGNESIUM: Magnesium: 2.1 mg/dL (ref 1.7–2.4)

## 2016-12-12 MED ORDER — SODIUM CHLORIDE 0.9 % IV BOLUS (SEPSIS)
500.0000 mL | Freq: Once | INTRAVENOUS | Status: AC
  Administered 2016-12-12: 500 mL via INTRAVENOUS

## 2016-12-12 NOTE — Progress Notes (Signed)
Pt seen and examined. No issues overnight.   EXAM: Temp:  [98.7 F (37.1 C)-100.4 F (38 C)] 100.4 F (38 C) (09/14 1200) Pulse Rate:  [71-84] 84 (09/14 1500) Resp:  [18-34] 19 (09/14 1600) BP: (118-158)/(65-100) 141/88 (09/14 1600) SpO2:  [98 %-100 %] 99 % (09/14 1600) FiO2 (%):  [40 %] 40 % (09/14 1600) Weight:  [67.4 kg (148 lb 9.4 oz)] 67.4 kg (148 lb 9.4 oz) (09/14 0400) Intake/Output      09/13 0701 - 09/14 0700 09/14 0701 - 09/15 0700   I.V. (mL/kg) 2192.7 (32.5) 675 (10)   NG/GT 930 450   IV Piggyback 110 160   Total Intake(mL/kg) 3232.7 (48) 1285 (19.1)   Urine (mL/kg/hr) 5095 (3.1) 1950 (3.1)   Drains 144 67   Total Output 5239 2017   Net -2006.3 -732        Stool Occurrence 2 x     No eye opening Pupils 5mm, non-reactive Breathing spontaneously on vent Grimaces to pain Localizes LUE to pain, minimal movement RUE W/D BLE to central pain EVD in place, bloody CSF draining  LABS: Lab Results  Component Value Date   CREATININE 0.49 12/12/2016   BUN 18 12/12/2016   NA 142 12/12/2016   K 3.5 12/12/2016   CL 110 12/12/2016   CO2 26 12/12/2016   Lab Results  Component Value Date   WBC 12.0 (H) 12/12/2016   HGB 10.4 (L) 12/12/2016   HCT 32.8 (L) 12/12/2016   MCV 95.6 12/12/2016   PLT 172 12/12/2016    IMAGING: TCD reviewed, not concerning for large vessel spasm  IMPRESSION: - 66 y.o. 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 Remains minimally responsive. - UTI, aspiration pneumonia  PLAN: - Cont current supportive care - EVD open at 5  I reviewed the situation with the patient's family. We did discuss the possibility of trach. All questions were answered.

## 2016-12-12 NOTE — Progress Notes (Signed)
Transcranial Doppler  Date POD PCO2 HCT BP  MCA ACA PCA OPHT SIPH VERT Basilar  9/6/rs     Right  Left   45  47   -38  - 42  31   -18  -23   -35      9/7, rds     Right  Left   45  43   -49  - 38  44   -21  -20   *      9/10 rs,vs     Right  Left   57  57   -55  -46   31  *   21  18   37  42   -34  -32   -56      9/12 cb     Right  Left   43  51   -28  -33   *  47   19      9/13  vs31     Right  Left   31  38   -22  -20   43  32   *   -30  -33     -32         Right  Left                                            Right  Left                                        MCA = Middle Cerebral Artery      OPHT = Opthalmic Artery     BASILAR = Basilar Artery   ACA = Anterior Cerebral Artery     SIPH = Carotid Siphon PCA = Posterior Cerebral Artery   VERT = Verterbral Artery                   Normal MCA = 62+\-12 ACA = 50+\-12 PCA = 42+\-23   9/7 - (*) not insonated. rds 9/10 - (*) not insonated. rds and vs. 9/12- (*) not insonated, cb 9/14 (*) not insonated, vs Levin Bacon- RDMS, RVT 1:17 PM  12/12/2016

## 2016-12-12 NOTE — Plan of Care (Signed)
Problem: Tissue Perfusion: Goal: Risk factors for ineffective tissue perfusion will decrease Outcome: Progressing Maintaining MAP>75

## 2016-12-12 NOTE — Progress Notes (Signed)
PULMONARY / CRITICAL CARE MEDICINE   Name: Shelby Ayala MRN: [id number] DOB: Jan 24, 1951    ADMISSION DATE:  12/03/2016 CONSULTATION DATE:  12/09/2016  REFERRING MD:  Conchita Paris / Neurosurgery  CHIEF COMPLAINT:  Altered Mental Status  Brief patient summary:  66 y.o. female status post coiling. Patient postoperatively had a cerebral infarct. Intraventricular drain in place.  emergent endotracheal intubation. Patient also notably febrile this morning. Further history cannot be obtained due to altered mental status.  SUBJECTIVE:  Weaning on PS 8/5 well since 0700 Levophed off 9/13 No acute events overnight per RN CVP 4-6, - 2L   VITAL SIGNS: BP (!) 146/82 (BP Location: Right Arm)   Pulse 71   Temp 99.1 F (37.3 C) (Axillary)   Resp 18   Ht  (1.702 m)   Wt 148 lb 9.4 oz (67.4 kg)   SpO2 100%   BMI 23.27 kg/m   HEMODYNAMICS: CVP:  [4 mmHg-9 mmHg] 6 mmHg  VENTILATOR SETTINGS: Vent Mode: CPAP;PSV FiO2 (%):  [40 %] 40 % Set Rate:  [14 bmp] 14 bmp Vt Set:  [490 mL] 490 mL PEEP:  [5 cmH20] 5 cmH20 Pressure Support:  [8 cmH20] 8 cmH20 Plateau Pressure:  [10 cmH20-13 cmH20] 13 cmH20  INTAKE / OUTPUT: I/O last 3 completed shifts: In: 6475.3 [I.V.:4835.3; NG/GT:1480; IV Piggyback:160] Out: 7829 [FAOZH:0865; Drains:215]  PHYSICAL EXAMINATION: General:  Critically ill appearing female lying in bed in NAD HEENT: MM pink/moist, ETT/ OGT, pupils 7/non-responsive Neuro: grimaces intermittently to pain, withdrawls in BLE, otherwise no f/c or open eyes, + cough CV:  rrr, no m/r/g PULM: even/non-labored, lungs bilaterally clear GI: soft, bs active  Extremities: warm/dry, no edema  Skin: no rashes   LABS:  BMET  Recent Labs Lab 12/10/16 0226 12/11/16 0500 12/12/16 0410  NA 147* 146* 142  K 4.2 3.0* 3.5  CL 117* 116* 110  CO2 BUN CREATININE 0.62 0.49 0.49  GLUCOSE 192* 191* 150*    Electrolytes  Recent Labs Lab 12/10/16 0226 12/11/16 0500  12/12/16 0410  CALCIUM 8.6* 8.7* 8.7*  MG 2.4 2.0 2.1  PHOS 2.8 1.7* 2.5    CBC  Recent Labs Lab 12/10/16 0226 12/11/16 0500 12/12/16 0410  WBC 9.4 22.8* 12.0*  HGB 11.5* 10.7* 10.4*  HCT 36.7 33.4* 32.8*  PLT 140* 243 172    Coag's No results for input(s): APTT, INR in the last 168 hours.  Sepsis Markers  Recent Labs Lab 12/09/16 1324 12/09/16 1907 12/09/16 2212 12/10/16 0226 12/11/16 0500  LATICACIDVEN 1.9 1.5 2.1*  --   --   PROCALCITON 0.31  --   --  31.94 16.64    ABG No results for input(s): PHART, PCO2ART, PO2ART in the last 168 hours.  Liver Enzymes  Recent Labs Lab 12/09/16 1324 12/10/16 0226 12/11/16 0500 12/12/16 0410  AST 24  --   --   --   ALT 19  --   --   --   ALKPHOS 55  --   --   --   BILITOT 1.0  --   --   --   ALBUMIN 2.6* 2.4* 2.2* 2.1*    Cardiac Enzymes No results for input(s): TROPONINI, PROBNP in the last 168 hours.  Glucose  Recent Labs Lab 12/11/16 1144 12/11/16 1534 12/11/16 1920 12/11/16 2338 12/12/16 0322 12/12/16 0838  GLUCAP 160* 182* 180* 149* 141* 155*    Imaging Dg Chest Port 1 View  Result Date:  12/12/2016 CLINICAL DATA:  Endotracheal tube.  Intracranial hemorrhage EXAM: PORTABLE CHEST 1 VIEW COMPARISON:  Four days ago FINDINGS: Endotracheal tube tip at the clavicular heads. Right IJ central line with tip at the upper cavoatrial junction. An orogastric tube reaches the stomach. Mild hazy and streaky density at the bases. IMPRESSION: 1. Stable positioning of tubes and central line. 2. Stable atelectasis at the bases. Electronically Signed   By: Marnee Spring M.D.   On: 12/12/2016 07:04    STUDIES:  MRI BRAIN W/O 9/8: IMPRESSION: 1. Status post coil embolization of basilar artery aneurysm. 2. 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 3. Subarachnoid hemorrhage and intraventricular blood products. RIGHT frontal ventriculostomy catheter without  hydrocephalus. 4. Punctate parietal infarcts versus susceptibility artifact from subarachnoid hemorrhage. 5. Moderate chronic small vessel ischemic disease. Old small cerebellar infarcts. PORT CXR 9/8:  Personally reviewed by me. No focal opacity. Enteric feeding tube coursing below diaphragm. No pleural effusion appreciated. CT HEAD W/O 9/10: IMPRESSION: 1. Interval partial dispersion of subarachnoid hemorrhage. Residual hemorrhage predominantly in occipital horns of lateral ventricles and scattered over the convexities. 2. Stable position of right frontal approach ventriculostomy catheter. Stable ventricle size. No hydrocephalus. 3. Stable distribution of bilateral medial thalamus infarcts. Infarction in upper midbrain is partially obscured by the basilar artery coil mass. 4. No new acute intracranial hemorrhage or infarct identified. EEG 9/13 >> mod diffuse slowing, neg for seizure   MICROBIOLOGY: MRSA PCR 9/5:  Negative Blood Cultures x2 9/11 >>> ngtd Urine Culture 9/11 >>> e.coli, pan- sensitive  Tracheal Aspirate Culture 9/11 >> staph a, pan-sensitive  ANTIBIOTICS: Vancomycin 9/11 >>>9/12 Zosyn 9/11 >>9/13 Ceftriaxone 9/13 >>  SIGNIFICANT EVENTS: 09/05 - Admit & coiling 09/11 - PCCM consult for worsening hypoxia & persistent altered mentation >> intubated  LINES/TUBES: OETT 7.5 9/11 >>> NGT >>> PIV R TL IJ CVC 9/12 >>  DISCUSSION:  66 y.o. female with minimal past medical history status post coiling and postprocedure care complicated by CVA. Altered mentation is likely resulted in some airway compromise and probable aspiration. Given patient's altered mental status and inability to protect airway as well as worsening hypoxia she required intubation 9/11.   ASSESSMENT / PLAN:  PULMONARY A: Acute hypoxic respiratory failure Aspiration pneumonia  P:   Continue PRVC 8 cc/kg  Wean PEEP/ FiO2 for sats > 92% Trend CXR  VAP protocol Daily SBT, no extubation given neuro  exam See ID  CARDIOVASCULAR A:  Hypotension +/- component of septic shock- resolved  P:  Tele monitoring  Goal MAP > 75 to maintain CPP Continue CVP monitoring q4, may need bolus to keep up with output Monitor electrolytes   RENAL A:   Hypernatremia/hyperchloremia Hypokalemia- improved Lactic acidosis   P:  NS at 75 ml/hr  Trend BMET / mag/ phos Strict I&Os, monitor UO Replace electrolytes as indicated  GASTROINTESTINAL A:   Diarrhea- minimal ,?stress incontinence with coughing   P:   Continue tube feedings PPI for SUP  HEMATOLOGIC A:   Thrombocytopenia- resolved Leukocytosis- likely steroid induced, PCT improving  P:  Trend CBC SCD for VTE ppx  INFECTIOUS A:   Possible sepsis Aspiration pneumonia- Staph - pan-sensitive  UTI - ecoli- pan-sensitive  P:   Monitor fever curve/ trend WBC PCT trending down Continue ceftriaxone as cultures pan-sensitive  ENDOCRINE A:   Hyperglycemia:  No h/o DM.    P:   Accu-Cheks every 4 hours  Increase to mod SSI  NEUROLOGIC A:   Acute Encephalopathy  SAH S/P Coiling and EVD Acute CVA- small thalamic and midbrain - 9/13 EEG neg for seizure, TCD without evidence of spasm P:   RASS goal: 0  Fentanyl IV prn pain EVD Management/ TCDs/ imaging per NSGY,  Defer imaging to NSGY, Continue keppra, decadron, nimodipine per NGSY  Updates:  Husband updated at bedside.   CCT 40 mins  Posey Boyer, AGACNP-BC [facility] Pgr: [phone] or if no answer [phone] 12/12/2016, 10:10 AM

## 2016-12-13 LAB — RENAL FUNCTION PANEL
ANION GAP: 6 (ref 5–15)
Albumin: 2.1 g/dL — ABNORMAL LOW (ref 3.5–5.0)
BUN: 18 mg/dL (ref 6–20)
CHLORIDE: 103 mmol/L (ref 101–111)
CO2: 26 mmol/L (ref 22–32)
CREATININE: 0.42 mg/dL — AB (ref 0.44–1.00)
Calcium: 8.2 mg/dL — ABNORMAL LOW (ref 8.9–10.3)
Glucose, Bld: 125 mg/dL — ABNORMAL HIGH (ref 65–99)
Phosphorus: 3 mg/dL (ref 2.5–4.6)
Potassium: 3.5 mmol/L (ref 3.5–5.1)
SODIUM: 135 mmol/L (ref 135–145)

## 2016-12-13 LAB — CBC WITH DIFFERENTIAL/PLATELET
BASOS PCT: 0 %
Basophils Absolute: 0 10*3/uL (ref 0.0–0.1)
EOS PCT: 0 %
Eosinophils Absolute: 0 10*3/uL (ref 0.0–0.7)
HEMATOCRIT: 35.4 % — AB (ref 36.0–46.0)
Hemoglobin: 11.6 g/dL — ABNORMAL LOW (ref 12.0–15.0)
Lymphocytes Relative: 15 %
Lymphs Abs: 1.7 10*3/uL (ref 0.7–4.0)
MCH: 30.4 pg (ref 26.0–34.0)
MCHC: 32.8 g/dL (ref 30.0–36.0)
MCV: 92.7 fL (ref 78.0–100.0)
MONO ABS: 1.2 10*3/uL — AB (ref 0.1–1.0)
MONOS PCT: 10 %
NEUTROS ABS: 8.7 10*3/uL — AB (ref 1.7–7.7)
Neutrophils Relative %: 75 %
Platelets: 200 10*3/uL (ref 150–400)
RBC: 3.82 MIL/uL — ABNORMAL LOW (ref 3.87–5.11)
RDW: 13.5 % (ref 11.5–15.5)
WBC: 11.6 10*3/uL — ABNORMAL HIGH (ref 4.0–10.5)

## 2016-12-13 LAB — GLUCOSE, CAPILLARY
GLUCOSE-CAPILLARY: 153 mg/dL — AB (ref 65–99)
GLUCOSE-CAPILLARY: 131 mg/dL — AB (ref 65–99)
GLUCOSE-CAPILLARY: 130 mg/dL — AB (ref 65–99)
Glucose-Capillary: 129 mg/dL — ABNORMAL HIGH (ref 65–99)
Glucose-Capillary: 134 mg/dL — ABNORMAL HIGH (ref 65–99)

## 2016-12-13 LAB — MAGNESIUM: MAGNESIUM: 1.9 mg/dL (ref 1.7–2.4)

## 2016-12-13 MED ORDER — POTASSIUM CHLORIDE 20 MEQ/15ML (10%) PO SOLN
40.0000 meq | Freq: Once | ORAL | Status: AC
  Administered 2016-12-13: 40 meq
  Filled 2016-12-13: qty 30

## 2016-12-13 MED ORDER — INFLUENZA VAC SPLIT HIGH-DOSE 0.5 ML IM SUSY
0.5000 mL | PREFILLED_SYRINGE | INTRAMUSCULAR | Status: AC
  Administered 2016-12-14: 0.5 mL via INTRAMUSCULAR
  Filled 2016-12-13: qty 0.5

## 2016-12-13 NOTE — Progress Notes (Signed)
PULMONARY / CRITICAL CARE MEDICINE   Name: Shelby Ayala MRN: [id number] DOB: 12-11-1950    ADMISSION DATE:  12/03/2016 CONSULTATION DATE:  12/09/2016  REFERRING MD:  Conchita Paris  CHIEF COMPLAINT:  Acute encephalopathy  BRIEF HISTORY OF PRESENT ILLNESS:   66 y/o female who had a SAH, received coiling, post operatively had a cerebral infarct, drain placed.  Intubated.  Noted to have some aspiration pneumonia, later UTI.    SUBJECTIVE:  Weaning No improvement in mental status   VITAL SIGNS: BP 136/87   Pulse 79   Temp 98.7 F (37.1 C) (Axillary)   Resp 18   Ht  (1.702 m)   Wt 142 lb 6.7 oz (64.6 kg)   SpO2 99%   BMI 22.31 kg/m   HEMODYNAMICS: CVP:  [1 mmHg-6 mmHg] 2 mmHg  VENTILATOR SETTINGS: Vent Mode: CPAP;PSV FiO2 (%):  [40 %] 40 % Set Rate:  [14 bmp] 14 bmp Vt Set:  [490 mL] 490 mL PEEP:  [5 cmH20] 5 cmH20 Pressure Support:  [8 cmH20] 8 cmH20 Plateau Pressure:  [12 cmH20-13 cmH20] 13 cmH20  INTAKE / OUTPUT: I/O last 3 completed shifts: In: 5410 [I.V.:2700; NG/GT:2330; IV Piggyback:380] Out: 6155 [Urine:5560; Drains:245; Stool:350]  PHYSICAL EXAMINATION:  General:  In bed on vent HENT: R ventricular drain in place, ETT in place PULM: CTA B, vent supported breathing CV: RRR, no mgr GI: BS+, soft, nontender MSK: normal bulk and tone Neuro: slight withdrawal, lifts head to pain on left hand    LABS:  BMET  Recent Labs Lab 12/11/16 0500 12/12/16 0410 12/13/16 0542  NA 146* 142 135  K 3.0* 3.5 3.5  CL 116* 110 103  CO2 BUN CREATININE 0.49 0.49 0.42*  GLUCOSE 191* 150* 125*    Electrolytes  Recent Labs Lab 12/11/16 0500 12/12/16 0410 12/13/16 0542  CALCIUM 8.7* 8.7* 8.2*  MG 2.0 2.1 1.9  PHOS 1.7* 2.5 3.0    CBC  Recent Labs Lab 12/11/16 0500 12/12/16 0410 12/13/16 0542  WBC 22.8* 12.0* 11.6*  HGB 10.7* 10.4* 11.6*  HCT 33.4* 32.8* 35.4*  PLT 243 172 200    Coag's No results for input(s): APTT,  INR in the last 168 hours.  Sepsis Markers  Recent Labs Lab 12/09/16 1324 12/09/16 1907 12/09/16 2212 12/10/16 0226 12/11/16 0500  LATICACIDVEN 1.9 1.5 2.1*  --   --   PROCALCITON 0.31  --   --  31.94 16.64    ABG No results for input(s): PHART, PCO2ART, PO2ART in the last 168 hours.  Liver Enzymes  Recent Labs Lab 12/09/16 1324  12/11/16 0500 12/12/16 0410 12/13/16 0542  AST 24  --   --   --   --   ALT 19  --   --   --   --   ALKPHOS 55  --   --   --   --   BILITOT 1.0  --   --   --   --   ALBUMIN 2.6*  < > 2.2* 2.1* 2.1*  < > = values in this interval not displayed.  Cardiac Enzymes No results for input(s): TROPONINI, PROBNP in the last 168 hours.  Glucose  Recent Labs Lab 12/12/16 1157 12/12/16 1625 12/12/16 2004 12/12/16 2344 12/13/16 0356 12/13/16 0742  GLUCAP 111* 163* 140* 126* 153* 131*    Imaging No results found.   STUDIES:  MRI BRAIN W/O 9/8: IMPRESSION: 1. Status post coil embolization of basilar  artery aneurysm. 2. 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 3. Subarachnoid hemorrhage and intraventricular blood products. RIGHT frontal ventriculostomy catheter without hydrocephalus. 4. Punctate parietal infarcts versus susceptibility artifact from subarachnoid hemorrhage. 5. Moderate chronic small vessel ischemic disease. Old small cerebellar infarcts. PORT CXR 9/8:  Personally reviewed by me. No focal opacity. Enteric feeding tube coursing below diaphragm. No pleural effusion appreciated. CT HEAD W/O 9/10: IMPRESSION: 1. Interval partial dispersion of subarachnoid hemorrhage. Residual hemorrhage predominantly in occipital horns of lateral ventricles and scattered over the convexities. 2. Stable position of right frontal approach ventriculostomy catheter. Stable ventricle size. No hydrocephalus. 3. Stable distribution of bilateral medial thalamus infarcts. Infarction in upper midbrain is partially  obscured by the basilar artery coil mass. 4. No new acute intracranial hemorrhage or infarct identified. EEG 9/13 >> mod diffuse slowing, neg for seizure   MICROBIOLOGY: MRSA PCR 9/5:  Negative Blood Cultures x2 9/11 >>> ngtd Urine Culture 9/11 >>> e.coli, Klebsiella cetriaxone sensitive  Tracheal Aspirate Culture 9/11 >> MSSA  ANTIBIOTICS: Vancomycin 9/11 >>>9/12 Zosyn 9/11 >>9/13 Ceftriaxone 9/13 >>  SIGNIFICANT EVENTS: 09/05 - Admit & coiling 09/11 - PCCM consult for worsening hypoxia & persistent altered mentation >> intubated  LINES/TUBES: OETT 7.5 9/11 >>> NGT >>> PIV R TL IJ CVC 9/12 >>  DISCUSSION:  66 y/o female with subarachnoid hemorrhage, course complicated by bilateral medial thalamus and midbrain infarcts, aspiraiton pneumonia, UTI and acute encephalopathy  ASSESSMENT / PLAN:  NEUROLOGIC A:   SAH Midbraine/thalamic infarct Persistent encephalopathy P:   RASS goal: 0  Fentanyl prn Ventricular drain management per neurosurgery Continue keppra, decadron per surgery Continue nimodipine  PULMONARY A: Acute respiratory failure with hypoxemia Aspiration pneumonia P:   Full mechanical vent support VAP prevention Daily WUA/SBT No plans for extubation until mental status improves Trach next week  CARDIOVASCULAR A:  S/p SAH P:  Tele Continue nimodipine, SBP goal < 160  RENAL A:   Hypokalemia P:   Monitor BMET and UOP Replace electrolytes as needed   GASTROINTESTINAL A:   No acute issues P:   Continue tube feeidng Pantoprazole for stress ulcer prophylaxis  HEMATOLOGIC A:   No acute issues P:  Monitor for bleeding  INFECTIOUS A:   MSSA pneumonia UTI: ecoli/klebsiella P:   Monitor for fever Continue ceftriaxone   ENDOCRINE A:   Hyperglycemia  P:   SSI  FAMILY  - Updates: husband updated bedside by me on 12/13/2016  My cc time 31 minutes  Heber K[place], MD [facility] Pager: [phone] Cell: [phone] After  3pm or if no response, call [phone]   12/13/2016, 9:00 AM

## 2016-12-13 NOTE — Progress Notes (Signed)
Subjective: No new issues or problems overnight.  Objective: Vital signs in last 24 hours: Temp:  [98 F (36.7 C)-100.4 F (38 C)] 98 F (36.7 C) (09/15 0800) Pulse Rate:  [75-113] 79 (09/15 0722) Resp:  [18-34] 19 (09/15 0900) BP: (136-158)/(77-100) 143/99 (09/15 0900) SpO2:  [96 %-100 %] 99 % (09/15 0900) FiO2 (%):  [40 %] 40 % (09/15 0800) Weight:  [64.6 kg (142 lb 6.7 oz)] 64.6 kg (142 lb 6.7 oz) (09/15 0200)  Intake/Output from previous day: 09/14 0701 - 09/15 0700 In: 3520 [I.V.:1800; NG/GT:1450; IV Piggyback:270] Out: 4436 [Urine:3915; Drains:171; Stool:350] Intake/Output this shift: Total I/O In: 325 [I.V.:225; NG/GT:100] Out: 510 [Urine:470; Drains:40]  No opening of eyes to noxious stimuli. Some facial grimacing. Pupils midposition and sluggish. Gaze conjugate. Minimal withdrawal of her extremities. Ventriculostomy drainage remains bloody.  Lab Results:  Recent Labs  12/12/16 0410 12/13/16 0542  WBC 12.0* 11.6*  HGB 10.4* 11.6*  HCT 32.8* 35.4*  PLT 172 200   BMET  Recent Labs  12/12/16 0410 12/13/16 0542  NA 142 135  K 3.5 3.5  CL 110 103  CO2 26 26  GLUCOSE 150* 125*  BUN 18 18  CREATININE 0.49 0.42*  CALCIUM 8.7* 8.2*    Studies/Results: Dg Chest Port 1 View  Result Date: 12/12/2016 CLINICAL DATA:  Endotracheal tube.  Intracranial hemorrhage EXAM: PORTABLE CHEST 1 VIEW COMPARISON:  Four days ago FINDINGS: Endotracheal tube tip at the clavicular heads. Right IJ central line with tip at the upper cavoatrial junction. An orogastric tube reaches the stomach. Mild hazy and streaky density at the bases. IMPRESSION: 1. Stable positioning of tubes and central line. 2. Stable atelectasis at the bases. Electronically Signed   By: Marnee Spring M.D.   On: 12/12/2016 07:04    Assessment/Plan: Status post basilar apex aneurysm coiling. 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 Continue supportive efforts.  LOS: 10 days      Marwin Primmer A 12/13/2016, 11:02 AM

## 2016-12-14 ENCOUNTER — Inpatient Hospital Stay (HOSPITAL_COMMUNITY): Payer: Medicare Other

## 2016-12-14 LAB — CULTURE, BLOOD (ROUTINE X 2)
CULTURE: NO GROWTH
Culture: NO GROWTH
Special Requests: ADEQUATE
Special Requests: ADEQUATE

## 2016-12-14 LAB — GLUCOSE, CAPILLARY
GLUCOSE-CAPILLARY: 133 mg/dL — AB (ref 65–99)
GLUCOSE-CAPILLARY: 141 mg/dL — AB (ref 65–99)
GLUCOSE-CAPILLARY: 148 mg/dL — AB (ref 65–99)
Glucose-Capillary: 128 mg/dL — ABNORMAL HIGH (ref 65–99)
Glucose-Capillary: 141 mg/dL — ABNORMAL HIGH (ref 65–99)
Glucose-Capillary: 150 mg/dL — ABNORMAL HIGH (ref 65–99)
Glucose-Capillary: 153 mg/dL — ABNORMAL HIGH (ref 65–99)

## 2016-12-14 LAB — RENAL FUNCTION PANEL
Albumin: 2 g/dL — ABNORMAL LOW (ref 3.5–5.0)
Anion gap: 6 (ref 5–15)
BUN: 21 mg/dL — AB (ref 6–20)
CALCIUM: 8.2 mg/dL — AB (ref 8.9–10.3)
CO2: 24 mmol/L (ref 22–32)
CREATININE: 0.4 mg/dL — AB (ref 0.44–1.00)
Chloride: 105 mmol/L (ref 101–111)
GFR calc non Af Amer: >60 mL/min (ref >60)
GLUCOSE: 142 mg/dL — AB (ref 65–99)
Phosphorus: 3.8 mg/dL (ref 2.5–4.6)
Potassium: 4.1 mmol/L (ref 3.5–5.1)
SODIUM: 135 mmol/L (ref 135–145)

## 2016-12-14 LAB — BASIC METABOLIC PANEL
ANION GAP: 6 (ref 5–15)
BUN: 21 mg/dL — ABNORMAL HIGH (ref 6–20)
CALCIUM: 8.2 mg/dL — AB (ref 8.9–10.3)
CO2: 24 mmol/L (ref 22–32)
CREATININE: 0.39 mg/dL — AB (ref 0.44–1.00)
Chloride: 105 mmol/L (ref 101–111)
GFR calc Af Amer: >60 mL/min (ref >60)
GLUCOSE: 146 mg/dL — AB (ref 65–99)
Potassium: 4.1 mmol/L (ref 3.5–5.1)
Sodium: 135 mmol/L (ref 135–145)

## 2016-12-14 LAB — CBC WITH DIFFERENTIAL/PLATELET
BASOS PCT: 0 %
Basophils Absolute: 0 10*3/uL (ref 0.0–0.1)
EOS ABS: 0 10*3/uL (ref 0.0–0.7)
Eosinophils Relative: 0 %
HCT: 35 % — ABNORMAL LOW (ref 36.0–46.0)
HEMOGLOBIN: 11.6 g/dL — AB (ref 12.0–15.0)
Lymphocytes Relative: 11 %
Lymphs Abs: 1.6 10*3/uL (ref 0.7–4.0)
MCH: 30.7 pg (ref 26.0–34.0)
MCHC: 33.1 g/dL (ref 30.0–36.0)
MCV: 92.6 fL (ref 78.0–100.0)
MONOS PCT: 7 %
Monocytes Absolute: 1.1 10*3/uL — ABNORMAL HIGH (ref 0.1–1.0)
NEUTROS PCT: 82 %
Neutro Abs: 11.6 10*3/uL — ABNORMAL HIGH (ref 1.7–7.7)
Platelets: 224 10*3/uL (ref 150–400)
RBC: 3.78 MIL/uL — ABNORMAL LOW (ref 3.87–5.11)
RDW: 13.2 % (ref 11.5–15.5)
WBC: 14.2 10*3/uL — AB (ref 4.0–10.5)

## 2016-12-14 LAB — MAGNESIUM: MAGNESIUM: 2.1 mg/dL (ref 1.7–2.4)

## 2016-12-14 MED ORDER — CHLORHEXIDINE GLUCONATE CLOTH 2 % EX PADS
6.0000 | MEDICATED_PAD | Freq: Every day | CUTANEOUS | Status: DC
  Administered 2016-12-15 – 2016-12-16 (×2): 6 via TOPICAL

## 2016-12-14 NOTE — Progress Notes (Signed)
No significant change in status. Patient remains unconscious and minimally responsive to noxious stimuli.  She remains afebrile. Her vital signs are stable. Fluid balance appears appropriate. Bowels are working.  No eye opening to noxious stimuli. Some facial grimacing to pain. Minimal withdrawal of her extremities to pain.  Status post basilar apex aneurysm rupture with subarachnoid hemorrhage. Patient with post-coiling diencephalic infarcts likely secondary to local vasospasm. Prognosis poor. Continue current care.

## 2016-12-14 NOTE — Progress Notes (Signed)
PULMONARY / CRITICAL CARE MEDICINE   Name: TRENTON VERNE MRN: [id number] DOB: 10/26/1950    ADMISSION DATE:  12/03/2016 CONSULTATION DATE:  12/09/2016  REFERRING MD:  Conchita Paris  CHIEF COMPLAINT:  Acute encephalopathy  BRIEF HISTORY OF PRESENT ILLNESS:   66 y/o female who had a SAH, received coiling, post operatively had a cerebral infarct, drain placed.  Intubated.  Noted to have some aspiration pneumonia, later UTI.    SUBJECTIVE:  Weaning again No change in mental status   VITAL SIGNS: BP 132/88 (BP Location: Left Arm)   Pulse 73   Temp 99.6 F (37.6 C) (Oral)   Resp 20   Ht  (1.702 m)   Wt 65.3 kg (143 lb 15.4 oz)   SpO2 98%   BMI 22.55 kg/m   HEMODYNAMICS: CVP:  [2 mmHg-7 mmHg] 4 mmHg  VENTILATOR SETTINGS: Vent Mode: PSV;CPAP FiO2 (%):  [40 %] 40 % Set Rate:  [14 bmp] 14 bmp Vt Set:  [490 mL] 490 mL PEEP:  [5 cmH20] 5 cmH20 Pressure Support:  [8 cmH20] 8 cmH20 Plateau Pressure:  [13 cmH20-19 cmH20] 13 cmH20  INTAKE / OUTPUT: I/O last 3 completed shifts: In: 5105 [I.V.:2625; NG/GT:2100; IV Piggyback:380] Out: 5503 [Urine:3900; Drains:228; Stool:1375]  PHYSICAL EXAMINATION:  General:  In bed on vent HENT: drain in place ETT in place PULM: CTA B, vent supported breathing CV: RRR, no mgr GI: BS+, soft, nontender MSK: normal bulk and tone Neuro: minimal response to external stimuli      LABS:  BMET  Recent Labs Lab 12/12/16 0410 12/13/16 0542 12/14/16 0458  NA 142 135 135  135  K 3.5 3.5 4.1  4.1  CL 110 103 105  105  CO2 BUN 18 18 21*  21*  CREATININE 0.49 0.42* 0.39*  0.40*  GLUCOSE 150* 125* 146*  142*    Electrolytes  Recent Labs Lab 12/12/16 0410 12/13/16 0542 12/14/16 0458  CALCIUM 8.7* 8.2* 8.2*  8.2*  MG 2.1 1.9 2.1  PHOS 2.5 3.0 3.8    CBC  Recent Labs Lab 12/12/16 0410 12/13/16 0542 12/14/16 0458  WBC 12.0* 11.6* 14.2*  HGB 10.4* 11.6* 11.6*  HCT 32.8* 35.4* 35.0*  PLT 172 200 224     Coag's No results for input(s): APTT, INR in the last 168 hours.  Sepsis Markers  Recent Labs Lab 12/09/16 1324 12/09/16 1907 12/09/16 2212 12/10/16 0226 12/11/16 0500  LATICACIDVEN 1.9 1.5 2.1*  --   --   PROCALCITON 0.31  --   --  31.94 16.64    ABG No results for input(s): PHART, PCO2ART, PO2ART in the last 168 hours.  Liver Enzymes  Recent Labs Lab 12/09/16 1324  12/12/16 0410 12/13/16 0542 12/14/16 0458  AST 24  --   --   --   --   ALT 19  --   --   --   --   ALKPHOS 55  --   --   --   --   BILITOT 1.0  --   --   --   --   ALBUMIN 2.6*  < > 2.1* 2.1* 2.0*  < > = values in this interval not displayed.  Cardiac Enzymes No results for input(s): TROPONINI, PROBNP in the last 168 hours.  Glucose  Recent Labs Lab 12/13/16 1212 12/13/16 1644 12/13/16 2036 12/14/16 0024 12/14/16 0414 12/14/16 0736  GLUCAP 130* 129* 134* 141* 153* 148*    Imaging Dg Chest  Port 1 View  Result Date: 12/14/2016 CLINICAL DATA:  Acute respiratory failure.  Ventilator support. EXAM: PORTABLE CHEST 1 VIEW COMPARISON:  12/12/2016 FINDINGS: Endotracheal tube tip is 2 cm above the carina. Nasogastric tube enters stomach. Right internal jugular central line is unchanged in the SVC. The left lung appears clear. Mild persistent infiltrate/volume loss in the right lower lobe. IMPRESSION: Lines and tubes well positioned. Left lung clear. Mild persistent infiltrate/volume loss in the right lower lobe. Electronically Signed   By: Paulina Fusi M.D.   On: 12/14/2016 06:56     STUDIES:  MRI BRAIN W/O 9/8: IMPRESSION: 1. Status post coil embolization of basilar artery aneurysm. 2. 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 3. Subarachnoid hemorrhage and intraventricular blood products. RIGHT frontal ventriculostomy catheter without hydrocephalus. 4. Punctate parietal infarcts versus susceptibility artifact from subarachnoid hemorrhage. 5.  Moderate chronic small vessel ischemic disease. Old small cerebellar infarcts. PORT CXR 9/8:  Personally reviewed by me. No focal opacity. Enteric feeding tube coursing below diaphragm. No pleural effusion appreciated. CT HEAD W/O 9/10: IMPRESSION: 1. Interval partial dispersion of subarachnoid hemorrhage. Residual hemorrhage predominantly in occipital horns of lateral ventricles and scattered over the convexities. 2. Stable position of right frontal approach ventriculostomy catheter. Stable ventricle size. No hydrocephalus. 3. Stable distribution of bilateral medial thalamus infarcts. Infarction in upper midbrain is partially obscured by the basilar artery coil mass. 4. No new acute intracranial hemorrhage or infarct identified. EEG 9/13 >> mod diffuse slowing, neg for seizure   MICROBIOLOGY: MRSA PCR 9/5:  Negative Blood Cultures x2 9/11 >>> ngtd Urine Culture 9/11 >>> e.coli, Klebsiella cetriaxone sensitive  Tracheal Aspirate Culture 9/11 >> MSSA  ANTIBIOTICS: Vancomycin 9/11 >>>9/12 Zosyn 9/11 >>9/13 Ceftriaxone 9/13 >>  SIGNIFICANT EVENTS: 09/05 - Admit & coiling 09/11 - PCCM consult for worsening hypoxia & persistent altered mentation >> intubated  LINES/TUBES: OETT 7.5 9/11 >>> NGT >>> PIV R TL IJ CVC 9/12 >>  DISCUSSION:  66 y/o female with subarachnoid hemorrhage, course complicated by bilateral medial thalamus and midbrain infarcts, aspiraiton pneumonia, UTI and acute encephalopathy  ASSESSMENT / PLAN:  NEUROLOGIC A:   SAH Midbraine/thalamic infarct Persistent encephalopathy P:   RASS goal 0 Fentanyl prn Ventricular drain management per NSGY Keppra, decadron per NSGY Continue nimodipine   PULMONARY A: Acute respiratory failure with hypoxemia Aspiration pneumonia P:   No plan for extubation until mental status improves Full mechanical vent support VAP prevention Daily WUA/SBT Trach this week   CARDIOVASCULAR A:  S/p SAH P:  Tele Continue  nimodipine  RENAL A:   Hypokalemia P:   Monitor BMET and UOP Replace electrolytes as needed    GASTROINTESTINAL A:   No acute issues P:   Continue tube feeidng Pantoprazole for stress ulcer prophylaxis  HEMATOLOGIC A:   No acute issues P:  Monitor for bleeding  INFECTIOUS A:   MSSA pneumonia UTI: ecoli/klebsiella P:   Monitor for fever Continue ceftriaxone  ENDOCRINE A:   Hyperglycemia  P:   SSI  FAMILY  - Updates: husband updated bedside by me on 12/14/2016  My cc time 30 minutes  Heber Nebo, MD [facility] Pager: [phone] Cell: [phone] After 3pm or if no response, call [phone]   12/14/2016, 9:06 AM

## 2016-12-15 ENCOUNTER — Inpatient Hospital Stay (HOSPITAL_COMMUNITY): Payer: Medicare Other

## 2016-12-15 DIAGNOSIS — G9340 Encephalopathy, unspecified: Secondary | ICD-10-CM

## 2016-12-15 DIAGNOSIS — I609 Nontraumatic subarachnoid hemorrhage, unspecified: Secondary | ICD-10-CM

## 2016-12-15 LAB — GLUCOSE, CAPILLARY
GLUCOSE-CAPILLARY: 132 mg/dL — AB (ref 65–99)
GLUCOSE-CAPILLARY: 130 mg/dL — AB (ref 65–99)
GLUCOSE-CAPILLARY: 135 mg/dL — AB (ref 65–99)
Glucose-Capillary: 142 mg/dL — ABNORMAL HIGH (ref 65–99)
Glucose-Capillary: 123 mg/dL — ABNORMAL HIGH (ref 65–99)
Glucose-Capillary: 117 mg/dL — ABNORMAL HIGH (ref 65–99)

## 2016-12-15 LAB — BASIC METABOLIC PANEL
Anion gap: 6 (ref 5–15)
BUN: 21 mg/dL — ABNORMAL HIGH (ref 6–20)
CHLORIDE: 108 mmol/L (ref 101–111)
CO2: 23 mmol/L (ref 22–32)
Calcium: 8.2 mg/dL — ABNORMAL LOW (ref 8.9–10.3)
Creatinine, Ser: 0.42 mg/dL — ABNORMAL LOW (ref 0.44–1.00)
Glucose, Bld: 139 mg/dL — ABNORMAL HIGH (ref 65–99)
POTASSIUM: 4.1 mmol/L (ref 3.5–5.1)
SODIUM: 137 mmol/L (ref 135–145)

## 2016-12-15 LAB — CBC WITH DIFFERENTIAL/PLATELET
BASOS ABS: 0 10*3/uL (ref 0.0–0.1)
BASOS PCT: 0 %
Eosinophils Absolute: 0 10*3/uL (ref 0.0–0.7)
Eosinophils Relative: 0 %
HEMATOCRIT: 34 % — AB (ref 36.0–46.0)
HEMOGLOBIN: 11.2 g/dL — AB (ref 12.0–15.0)
Lymphocytes Relative: 11 %
Lymphs Abs: 1.5 10*3/uL (ref 0.7–4.0)
MCH: 30.5 pg (ref 26.0–34.0)
MCHC: 32.9 g/dL (ref 30.0–36.0)
MCV: 92.6 fL (ref 78.0–100.0)
MONOS PCT: 8 %
Monocytes Absolute: 1.1 10*3/uL — ABNORMAL HIGH (ref 0.1–1.0)
NEUTROS ABS: 11.4 10*3/uL — AB (ref 1.7–7.7)
NEUTROS PCT: 81 %
Platelets: 234 10*3/uL (ref 150–400)
RBC: 3.67 MIL/uL — ABNORMAL LOW (ref 3.87–5.11)
RDW: 13.3 % (ref 11.5–15.5)
WBC: 14 10*3/uL — AB (ref 4.0–10.5)

## 2016-12-15 LAB — ALBUMIN: ALBUMIN: 2.1 g/dL — AB (ref 3.5–5.0)

## 2016-12-15 LAB — MAGNESIUM: MAGNESIUM: 2.2 mg/dL (ref 1.7–2.4)

## 2016-12-15 LAB — PHOSPHORUS: PHOSPHORUS: 3.5 mg/dL (ref 2.5–4.6)

## 2016-12-15 NOTE — Progress Notes (Signed)
Patient ID: Shelby Ayala, female   DOB: Apr 27, 1950, 66 y.o.   MRN: [id number] BP (!) 152/83   Pulse 73   Temp 97.8 F (36.6 C) (Axillary)   Resp 20   Ht  (1.702 m)   Wt 65.7 kg (144 lb 13.5 oz)   SpO2 98%   BMI 22.69 kg/m  MRI scan rather innocuous, no new changes, no new infarcts. Midbrain dysfunction still most likely cause of current exam

## 2016-12-15 NOTE — Progress Notes (Addendum)
No issues overnight.   EXAM:  BP (!) 142/90   Pulse 94   Temp 98.1 F (36.7 C) (Axillary)   Resp 19   Ht  (1.702 m)   Wt 65.7 kg (144 lb 13.5 oz)   SpO2 99%   BMI 22.69 kg/m   Grimaces to pain No eye opening Pupils non-reactive Breathing spontaneously Minimal w/d BLE EVD in place, functional  TCD reviewed, velocities remain normal.  IMPRESSION:  66 y.o. female SAH d# 13 s/p coiling of basilar artery. Remains minimally conscious without significant change over the past week.  PLAN: - Will cont supportive care - Will plan on obtaining MRI brain w/o Gad to help with prognostication, will help to decide on trach/PEG for long-term care v one-way extubation  I reviewed the situation with the patient's family. We discussed the plan above. All questions were answered.

## 2016-12-15 NOTE — Progress Notes (Signed)
Transcranial Doppler  Date POD PCO2 HCT BP  MCA ACA PCA OPHT SIPH VERT Basilar  9/6/rs     Right  Left   45  47   -38  - 42  31   -18  -23   -35      9/7, rds     Right  Left   45  43   -49  - 38  44   -21  -20   *      9/10 rs,vs     Right  Left   57  57   -55  -46   31  *   21  18   37  42   -34  -32   -56      9/12 cb     Right  Left   43  51   -28  -33   *  47   19      9/13  vs     Right  Left   31  38   -22  -20   43  32   *   -30  -33     -32    9/17 je     Right  Left   43  54   -27  -52   34  37   -34  -39   -42           Right  Left                                        MCA = Middle Cerebral Artery      OPHT = Opthalmic Artery     BASILAR = Basilar Artery   ACA = Anterior Cerebral Artery     SIPH = Carotid Siphon PCA = Posterior Cerebral Artery   VERT = Verterbral Artery                   Normal MCA = 62+\-12 ACA = 50+\-12 PCA = 42+\-23   9/7 - (*) not insonated. rds 9/10 - (*) not insonated. rds and vs. 9/12- (*) not insonated, cb 9/14 (*) not insonated, vs 9/17 Could not calculate right Linegaard ratio as cannot access patient's right neck due to lines. Left = 1.86 . Shelby Ayala

## 2016-12-15 NOTE — Progress Notes (Addendum)
PULMONARY / CRITICAL CARE MEDICINE   Name: Shelby Ayala MRN: [id number] DOB: 01-25-1951    ADMISSION DATE:  12/03/2016 CONSULTATION DATE:  12/09/2016  REFERRING MD:  Conchita Paris  CHIEF COMPLAINT:  Acute encephalopathy  BRIEF HISTORY OF PRESENT ILLNESS:   66 y/o female who had a SAH, received coiling, post operatively had a cerebral infarct, drain placed.  Intubated.  Noted to have some aspiration pneumonia, later UTI.   SUBJECTIVE:  Weaning well, no events overnight, no changes in mental status  VITAL SIGNS: BP 137/73   Pulse 69   Temp 98 F (36.7 C) (Axillary)   Resp 19   Ht  (1.702 m)   Wt 65.7 kg (144 lb 13.5 oz)   SpO2 99%   BMI 22.69 kg/m   HEMODYNAMICS: CVP:  [1 mmHg-5 mmHg] 5 mmHg  VENTILATOR SETTINGS: Vent Mode: PSV;CPAP FiO2 (%):  [30 %-40 %] 30 % Set Rate:  [14 bmp] 14 bmp Vt Set:  [490 mL] 490 mL PEEP:  [5 cmH20] 5 cmH20 Pressure Support:  [5 cmH20-8 cmH20] 5 cmH20 Plateau Pressure:  [7 cmH20-11 cmH20] 7 cmH20  INTAKE / OUTPUT: I/O last 3 completed shifts: In: 4920 [I.V.:2700; NG/GT:1950; IV Piggyback:270] Out: 4812 [Urine:2845; Drains:142; Stool:1825]  PHYSICAL EXAMINATION:  General:  In bed on vent, unresponsive HENNT: Drain and ETT in place PULM: CTA bilaterally CV: RRR, Nl S1/S2, -M/R/G. GI: Soft, NT, ND and +BS MSK: WNL Neuro: Minimal response to stimuli  LABS:  BMET  Recent Labs Lab 12/13/16 0542 12/14/16 0458 12/15/16 0450  NA 135 135  135 137  K 3.5 4.1  4.1 4.1  CL 103 105  105 108  CO2 BUN 18 21*  21* 21*  CREATININE 0.42* 0.39*  0.40* 0.42*  GLUCOSE 125* 146*  142* 139*   Electrolytes  Recent Labs Lab 12/13/16 0542 12/14/16 0458 12/15/16 0450  CALCIUM 8.2* 8.2*  8.2* 8.2*  MG 1.9 2.1 2.2  PHOS 3.0 3.8 3.5   CBC  Recent Labs Lab 12/13/16 0542 12/14/16 0458 12/15/16 0450  WBC 11.6* 14.2* 14.0*  HGB 11.6* 11.6* 11.2*  HCT 35.4* 35.0* 34.0*  PLT 200 224 234   Coag's No results  for input(s): APTT, INR in the last 168 hours.  Sepsis Markers  Recent Labs Lab 12/09/16 1324 12/09/16 1907 12/09/16 2212 12/10/16 0226 12/11/16 0500  LATICACIDVEN 1.9 1.5 2.1*  --   --   PROCALCITON 0.31  --   --  31.94 16.64   ABG No results for input(s): PHART, PCO2ART, PO2ART in the last 168 hours.  Liver Enzymes  Recent Labs Lab 12/09/16 1324  12/13/16 0542 12/14/16 0458 12/15/16 0450  AST 24  --   --   --   --   ALT 19  --   --   --   --   ALKPHOS 55  --   --   --   --   BILITOT 1.0  --   --   --   --   ALBUMIN 2.6*  < > 2.1* 2.0* 2.1*  < > = values in this interval not displayed.  Cardiac Enzymes No results for input(s): TROPONINI, PROBNP in the last 168 hours.  Glucose  Recent Labs Lab 12/14/16 1219 12/14/16 1629 12/14/16 2012 12/14/16 2355 12/15/16 0420 12/15/16 0750  GLUCAP 141* 128* 150* 133* 132* 142*   Imaging No results found.  STUDIES:  MRI BRAIN W/O 9/8: IMPRESSION: 1. Status post coil embolization  of basilar artery aneurysm. 2. 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 3. Subarachnoid hemorrhage and intraventricular blood products. RIGHT frontal ventriculostomy catheter without hydrocephalus. 4. Punctate parietal infarcts versus susceptibility artifact from subarachnoid hemorrhage. 5. Moderate chronic small vessel ischemic disease. Old small cerebellar infarcts. PORT CXR 9/8:  Personally reviewed by me. No focal opacity. Enteric feeding tube coursing below diaphragm. No pleural effusion appreciated. CT HEAD W/O 9/10: IMPRESSION: 1. Interval partial dispersion of subarachnoid hemorrhage. Residual hemorrhage predominantly in occipital horns of lateral ventricles and scattered over the convexities. 2. Stable position of right frontal approach ventriculostomy catheter. Stable ventricle size. No hydrocephalus. 3. Stable distribution of bilateral medial thalamus infarcts. Infarction in upper midbrain  is partially obscured by the basilar artery coil mass. 4. No new acute intracranial hemorrhage or infarct identified. EEG 9/13 >> mod diffuse slowing, neg for seizure   MICROBIOLOGY: MRSA PCR 9/5:  Negative Blood Cultures x2 9/11 >>> ngtd Urine Culture 9/11 >>> E Coli, Klebsiella cetriaxone sensitive  Tracheal Aspirate Culture 9/11 >> MSSA  ANTIBIOTICS: Vancomycin 9/11 >>>9/12 Zosyn 9/11 >>9/13  Ceftriaxone 9/13 >>  SIGNIFICANT EVENTS: 09/05 - Admit & coiling 09/11 - PCCM consult for worsening hypoxia & persistent altered mentation >> intubated  LINES/TUBES: OETT 7.5 9/11 >>> NGT >>> PIV R TL IJ CVC 9/12 >>  DISCUSSION:  66 y/o female with subarachnoid hemorrhage, course complicated by bilateral medial thalamus and midbrain infarcts, aspiraiton pneumonia, UTI and acute encephalopathy  ASSESSMENT / PLAN:  NEUROLOGIC A:   SAH Midbraine/thalamic infarct Persistent encephalopathy P:   RASS goal 0 Fentanyl prn Ventricular drain management per NSGY Keppra, decadron per NSGY Continue nimodipine  PULMONARY A: Acute respiratory failure with hypoxemia Aspiration pneumonia P:   Trach at one point this week Anticipate fast liberation from mechanical ventilation once trached PS trials as tolerated VAP prevention Daily WUA/SBT  CARDIOVASCULAR A:  S/p SAH P:  Tele Continue nimodipine per NS  RENAL A:   Hypokalemia P:   Monitor BMET and UOP Replace electrolytes as needed KVO IVF  GASTROINTESTINAL A:   No acute issues P:   Continue tube feeidng Pantoprazole for stress ulcer prophylaxis  HEMATOLOGIC A:   No acute issues P:  Monitor for bleeding  INFECTIOUS A:   MSSA pneumonia UTI: ecoli/klebsiella P:   Monitor for fever Continue ceftriaxone for total of 8 days  ENDOCRINE A:   Hyperglycemia  P:   SSI  FAMILY  - Updates: Discussed with NS, trach recommended.  Updated husband and son, will order MRI pending prognostication will decide on  tracheostomy.  The patient is critically ill with multiple organ systems failure and requires high complexity decision making for assessment and support, frequent evaluation and titration of therapies, application of advanced monitoring technologies and extensive interpretation of multiple databases.   Critical Care Time devoted to patient care services described in this note is  35  Minutes. This time reflects time of care of this signee Dr Koren Bound. This critical care time does not reflect procedure time, or teaching time or supervisory time of PA/NP/Med student/Med Resident etc but could involve care discussion time.  Alyson Reedy, M.D. [facility] Pulmonary/Critical Care Medicine. Pager: [phone]. After hours pager: [phone].  12/15/2016, 8:55 AM

## 2016-12-15 NOTE — Progress Notes (Signed)
Patient transported to MRI on ventilator and back. Vitals stable.

## 2016-12-16 ENCOUNTER — Inpatient Hospital Stay (HOSPITAL_COMMUNITY): Payer: Medicare Other

## 2016-12-16 DIAGNOSIS — Z7189 Other specified counseling: Secondary | ICD-10-CM

## 2016-12-16 DIAGNOSIS — Z515 Encounter for palliative care: Secondary | ICD-10-CM

## 2016-12-16 LAB — CBC WITH DIFFERENTIAL/PLATELET
Basophils Absolute: 0 10*3/uL (ref 0.0–0.1)
Basophils Relative: 0 %
EOS PCT: 0 %
Eosinophils Absolute: 0 10*3/uL (ref 0.0–0.7)
HEMATOCRIT: 33 % — AB (ref 36.0–46.0)
Hemoglobin: 11.1 g/dL — ABNORMAL LOW (ref 12.0–15.0)
LYMPHS ABS: 1.3 10*3/uL (ref 0.7–4.0)
LYMPHS PCT: 9 %
MCH: 30.9 pg (ref 26.0–34.0)
MCHC: 33.6 g/dL (ref 30.0–36.0)
MCV: 91.9 fL (ref 78.0–100.0)
MONO ABS: 0.8 10*3/uL (ref 0.1–1.0)
MONOS PCT: 5 %
NEUTROS ABS: 13.3 10*3/uL — AB (ref 1.7–7.7)
Neutrophils Relative %: 86 %
PLATELETS: 243 10*3/uL (ref 150–400)
RBC: 3.59 MIL/uL — ABNORMAL LOW (ref 3.87–5.11)
RDW: 13.1 % (ref 11.5–15.5)
WBC: 15.5 10*3/uL — ABNORMAL HIGH (ref 4.0–10.5)

## 2016-12-16 LAB — GLUCOSE, CAPILLARY
GLUCOSE-CAPILLARY: 116 mg/dL — AB (ref 65–99)
GLUCOSE-CAPILLARY: 136 mg/dL — AB (ref 65–99)
GLUCOSE-CAPILLARY: 128 mg/dL — AB (ref 65–99)
GLUCOSE-CAPILLARY: 128 mg/dL — AB (ref 65–99)
Glucose-Capillary: 152 mg/dL — ABNORMAL HIGH (ref 65–99)
Glucose-Capillary: 137 mg/dL — ABNORMAL HIGH (ref 65–99)

## 2016-12-16 LAB — BLOOD GAS, ARTERIAL
Acid-base deficit: 0.6 mmol/L (ref 0.0–2.0)
Bicarbonate: 22.6 mmol/L (ref 20.0–28.0)
Drawn by: 418751
FIO2: 30
O2 Saturation: 98.8 %
PCO2 ART: 31.7 mmHg — AB (ref 32.0–48.0)
PEEP: 5 cmH2O
PO2 ART: 143 mmHg — AB (ref 83.0–108.0)
Patient temperature: 98.6
RATE: 14 resp/min
VT: 490 mL
pH, Arterial: 7.468 — ABNORMAL HIGH (ref 7.350–7.450)

## 2016-12-16 LAB — RENAL FUNCTION PANEL
Albumin: 2.2 g/dL — ABNORMAL LOW (ref 3.5–5.0)
Anion gap: 5 (ref 5–15)
BUN: 18 mg/dL (ref 6–20)
CALCIUM: 8 mg/dL — AB (ref 8.9–10.3)
CO2: 23 mmol/L (ref 22–32)
CREATININE: 0.38 mg/dL — AB (ref 0.44–1.00)
Chloride: 105 mmol/L (ref 101–111)
Glucose, Bld: 146 mg/dL — ABNORMAL HIGH (ref 65–99)
Phosphorus: 4.1 mg/dL (ref 2.5–4.6)
Potassium: 4.5 mmol/L (ref 3.5–5.1)
SODIUM: 133 mmol/L — AB (ref 135–145)

## 2016-12-16 LAB — PHOSPHORUS: Phosphorus: 3.9 mg/dL (ref 2.5–4.6)

## 2016-12-16 LAB — MAGNESIUM: Magnesium: 2.1 mg/dL (ref 1.7–2.4)

## 2016-12-16 NOTE — Progress Notes (Signed)
Extensive family discussions during the day.  Family originally was leaning towards a trach/peg to give more time to heal but after discussion with Dr. Conchita Paris and myself they are now aware that complete recovery is essentially not there.  After extensive discussions throughout the day the patient's family brought her living will and the rest of her children who informed me that patient would never want to be in a SNF bed ridden and unless she would fully recover she would not want to be alive this way.    They are not ready for withdrawal yet, would like to discuss farther but are clear that she would rather be DNR.  The patient is critically ill with multiple organ systems failure and requires high complexity decision making for assessment and support, frequent evaluation and titration of therapies, application of advanced monitoring technologies and extensive interpretation of multiple databases.   Critical Care Time devoted to patient care services described in this note is  60  Minutes. This time reflects time of care of this signee Dr Koren Bound. This critical care time does not reflect procedure time, or teaching time or supervisory time of PA/NP/Med student/Med Resident etc but could involve care discussion time.  Alyson Reedy, M.D. [facility] Pulmonary/Critical Care Medicine. Pager: [phone]. After hours pager: [phone].

## 2016-12-16 NOTE — Progress Notes (Signed)
PULMONARY / CRITICAL CARE MEDICINE   Name: Shelby Ayala MRN: [id number] DOB: Feb 15, 1951    ADMISSION DATE:  12/03/2016 CONSULTATION DATE:  12/09/2016  REFERRING MD:  Conchita Paris  CHIEF COMPLAINT:  Acute encephalopathy  BRIEF HISTORY OF PRESENT ILLNESS:   66 y/o female who had a SAH, received coiling, post operatively had a cerebral infarct, drain placed.  Intubated.  Noted to have some aspiration pneumonia, later UTI.   SUBJECTIVE:  No events overnight, weaning on 5/5, MRI of the brain, no change  VITAL SIGNS: BP 125/73   Pulse 66   Temp 98.6 F (37 C) (Axillary)   Resp (!) 21   Ht  (1.702 m)   Wt 66.5 kg (146 lb 9.7 oz)   SpO2 99%   BMI 22.96 kg/m   HEMODYNAMICS: CVP:  [0 mmHg-6 mmHg] 2 mmHg  VENTILATOR SETTINGS: Vent Mode: PRVC FiO2 (%):  [30 %] 30 % Set Rate:  [14 bmp] 14 bmp Vt Set:  [490 mL] 490 mL PEEP:  [5 cmH20] 5 cmH20 Pressure Support:  [8 cmH20] 8 cmH20 Plateau Pressure:  [10 cmH20-14 cmH20] 12 cmH20  INTAKE / OUTPUT: I/O last 3 completed shifts: In: 3551.3 [I.V.:1968.8; NG/GT:1312.5; IV Piggyback:270] Out: 4421 [Urine:3000; Drains:121; Stool:1300]  PHYSICAL EXAMINATION:  General:  In bed, unresponsive, on vent HENNT: Vader/AT, PERRL, EOM-I and MMM PULM: CTA bilaterally CV: RRR, Nl S1/S2, -M/R/G. GI: Soft, NT, ND and +BS MSK: WNL Neuro: Minimal response to stimuli  LABS:  BMET  Recent Labs Lab 12/14/16 0458 12/15/16 0450 12/16/16 0340  NA 135  135 137 133*  K 4.1  4.1 4.1 4.5  CL 105  105 108 105  CO2 BUN 21*  21* 21* 18  CREATININE 0.39*  0.40* 0.42* 0.38*  GLUCOSE 146*  142* 139* 146*   Electrolytes  Recent Labs Lab 12/14/16 0458 12/15/16 0450 12/16/16 0340  CALCIUM 8.2*  8.2* 8.2* 8.0*  MG 2.1 2.2 2.1  PHOS 3.8 3.5 4.1  3.9   CBC  Recent Labs Lab 12/14/16 0458 12/15/16 0450 12/16/16 0340  WBC 14.2* 14.0* 15.5*  HGB 11.6* 11.2* 11.1*  HCT 35.0* 34.0* 33.0*  PLT 224 234 243   Coag's No  results for input(s): APTT, INR in the last 168 hours.  Sepsis Markers  Recent Labs Lab 12/09/16 1324 12/09/16 1907 12/09/16 2212 12/10/16 0226 12/11/16 0500  LATICACIDVEN 1.9 1.5 2.1*  --   --   PROCALCITON 0.31  --   --  31.94 16.64   ABG  Recent Labs Lab 12/16/16 0410  PHART 7.468*  PCO2ART 31.7*  PO2ART 143*    Liver Enzymes  Recent Labs Lab 12/09/16 1324  12/14/16 0458 12/15/16 0450 12/16/16 0340  AST 24  --   --   --   --   ALT 19  --   --   --   --   ALKPHOS 55  --   --   --   --   BILITOT 1.0  --   --   --   --   ALBUMIN 2.6*  < > 2.0* 2.1* 2.2*  < > = values in this interval not displayed.  Cardiac Enzymes No results for input(s): TROPONINI, PROBNP in the last 168 hours.  Glucose  Recent Labs Lab 12/15/16 1219 12/15/16 1517 12/15/16 2002 12/15/16 2328 12/16/16 0343 12/16/16 0755  GLUCAP 130* 135* 123* 117* 152* 116*   Imaging Mr Brain Wo Contrast  Result Date: 12/15/2016  CLINICAL DATA:  Follow-up examination for acute subarachnoid hemorrhage status post coil embolization of basilar artery aneurysm. EXAM: MRI HEAD WITHOUT CONTRAST TECHNIQUE: Multiplanar, multiecho pulse sequences of the brain and surrounding structures were obtained without intravenous contrast. COMPARISON:  Comparison made with prior CT from 12/08/2016 as well as previous MRI from 12/06/2016. FINDINGS: Brain: Cerebral volume is stable. Moderate chronic microvascular ischemic disease, unchanged. Sequelae of prior choroidal embolization of basilar tip aneurysm. There has been interval evolution of previously seen posterior circulation infarcts most notable at the bilateral thalami/midbrain and left cerebellar hemisphere. No evidence for interval hemorrhagic transformation or significant mass effect. No definite new infarcts as compared to previous. Minimal patchy diffusion abnormality seen overlying the posterior cerebral convexities favored to be related to artifact from underlying  subarachnoid hemorrhage. Right frontal approach ventricular catheter remains in place with tip in the right lateral ventricle near the septum pellucidum. Diffuse subarachnoid hemorrhage involving both cerebral hemispheres overall decreased from previous MRI. Associated intraventricular hemorrhage related to redistribution, also decreased. Stable ventricular size without hydrocephalus. No midline shift or mass effect. Trace extra-axial blood products noted as well. Pituitary gland and suprasellar region normal. Midline structures intact and normal. Vascular: Susceptibility artifact at the basilar tip related to coil embolization. Major intracranial vascular flow voids maintained. Skull and upper cervical spine: Craniocervical junction normal. Upper cervical spine within normal limits. Bone marrow signal intensity normal. Sinuses/Orbits: Globes and orbital soft tissues within normal limits. Paranasal sinuses are clear. Layering fluid noted within the nasopharynx. Patient is intubated. Right mastoid effusion noted. Inner ear structures normal. Other: None. IMPRESSION: 1. Status post coil embolization of basilar artery aneurysm. 2. Normal expected interval evolution of small posterior circulation infarcts, with no new or interval infarct identified. 3. Persistent subarachnoid and intraventricular hemorrhage, overall decreased from previous. Right frontal ventricular catheter remains in place without hydrocephalus. 4. Underlying chronic small vessel ischemic disease. 5. No other new acute intracranial abnormality. Electronically Signed   By: Rise Mu M.D.   On: 12/15/2016 19:28   Dg Chest Port 1 View  Result Date: 12/16/2016 CLINICAL DATA:  Intubation.  Brain hemorrhage. EXAM: PORTABLE CHEST 1 VIEW COMPARISON:  09/16/ 2018 .  09/14/ 2018 . FINDINGS: Endotracheal tube, NG tube, right IJ line in stable position. Heart size normal. No focal infiltrate. No pleural effusion or pneumothorax. IMPRESSION: 1.  Lines and tubes in stable position. 2.  Cardiomegaly with normal pulmonary vascularity. Electronically Signed   By: Maisie Fus  Register   On: 12/16/2016 07:29    STUDIES:  MRI BRAIN W/O 9/8: IMPRESSION: 1. Status post coil embolization of basilar artery aneurysm. 2. 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 3. Subarachnoid hemorrhage and intraventricular blood products. RIGHT frontal ventriculostomy catheter without hydrocephalus. 4. Punctate parietal infarcts versus susceptibility artifact from subarachnoid hemorrhage. 5. Moderate chronic small vessel ischemic disease. Old small cerebellar infarcts. PORT CXR 9/8:  Personally reviewed by me. No focal opacity. Enteric feeding tube coursing below diaphragm. No pleural effusion appreciated. CT HEAD W/O 9/10: IMPRESSION: 1. Interval partial dispersion of subarachnoid hemorrhage. Residual hemorrhage predominantly in occipital horns of lateral ventricles and scattered over the convexities. 2. Stable position of right frontal approach ventriculostomy catheter. Stable ventricle size. No hydrocephalus. 3. Stable distribution of bilateral medial thalamus infarcts. Infarction in upper midbrain is partially obscured by the basilar artery coil mass. 4. No new acute intracranial hemorrhage or infarct identified. EEG 9/13 >> mod diffuse slowing, neg for seizure   MICROBIOLOGY: MRSA PCR 9/5:  Negative  Blood Cultures x2 9/11 >>> ngtd Urine Culture 9/11 >>> E Coli, Klebsiella cetriaxone sensitive  Tracheal Aspirate Culture 9/11 >> MSSA  ANTIBIOTICS: Vancomycin 9/11 >>>9/12 Zosyn 9/11 >>9/13  Ceftriaxone 9/13 >>  SIGNIFICANT EVENTS: 09/05 - Admit & coiling 09/11 - PCCM consult for worsening hypoxia & persistent altered mentation >> intubated  LINES/TUBES: OETT 7.5 9/11 >>> NGT >>> PIV R TL IJ CVC 9/12 >>  DISCUSSION:  66 y/o female with subarachnoid hemorrhage, course complicated by bilateral medial  thalamus and midbrain infarcts, aspiraiton pneumonia, UTI and acute encephalopathy  ASSESSMENT / PLAN:  NEUROLOGIC A:   SAH Midbraine/thalamic infarct Persistent encephalopathy P:   RASS goal 0 D/C fentanyl Ventricular drain management per NSGY Keppra, decadron per NSGY Continue nimodipine  PULMONARY A: Acute respiratory failure with hypoxemia Aspiration pneumonia P:   Spoke with family, husband will speak with son and daughter then decide on trach/peg today Anticipate trach this week per family discussion Anticipate fast liberation from mechanical ventilation once trached PS trials as tolerated VAP prevention Daily WUA/SBT  CARDIOVASCULAR A:  S/p SAH P:  Tele Continue nimodipine per NS  RENAL A:   Hypokalemia P:   Monitor BMET and UOP Replace electrolytes as needed KVO IVF  GASTROINTESTINAL A:   No acute issues P:   Continue tube feeidng Pantoprazole for stress ulcer prophylaxis Will need PEG tube placement  HEMATOLOGIC A:   No acute issues P:  Monitor for bleeding  INFECTIOUS A:   MSSA pneumonia UTI: ecoli/klebsiella P:   Monitor for fever Continue ceftriaxone for total of 8 days  ENDOCRINE A:   Hyperglycemia  P:   SSI  FAMILY  - Updates: Husband speaking with children and to decide on trach/peg.  The patient is critically ill with multiple organ systems failure and requires high complexity decision making for assessment and support, frequent evaluation and titration of therapies, application of advanced monitoring technologies and extensive interpretation of multiple databases.   Critical Care Time devoted to patient care services described in this note is  35  Minutes. This time reflects time of care of this signee Dr Koren Bound. This critical care time does not reflect procedure time, or teaching time or supervisory time of PA/NP/Med student/Med Resident etc but could involve care discussion time.  Alyson Reedy, M.D. [facility]  Pulmonary/Critical Care Medicine. Pager: [phone]. After hours pager: [phone].  12/16/2016, 8:33 AM

## 2016-12-16 NOTE — Progress Notes (Signed)
Nutrition Follow-up  DOCUMENTATION CODES:   Not applicable  INTERVENTION:  Vital AF 1.2 @ 44m/h (12029mper day) 30 ml Prostat daily   TF regimen to provide 1540 kcals, 105 gm protein, 973 ml of free water daily  NUTRITION DIAGNOSIS:   Inadequate oral intake related to inability to eat as evidenced by NPO status.  Ongoing   GOAL:   Patient will meet greater than or equal to 90% of their needs  Met via TF regimen  MONITOR:   Vent status, TF tolerance, Labs, Weight trends, Skin, I & O's  ASSESSMENT:   [age].o. Female; while working out on a treadmill stepped off the treadmill and was unresponsive. Head CT showed large amount of subarachnoid blood.  Discussed pt with RN. Pt likely transitioning to comfort care. Per critical care and neurosurgery notes, pt's recovery to functional independence chance is negligible.  RN reports pt has had diarrhea via rectal tube but has lightened a bit. Pt tested C.Diff negative.  Patient is currently intubated on ventilator support MV: 12.2 L/min Temp (24hrs), Avg:98.3 F (36.8 C), Min:97.8 F (36.6 C), Max:98.6 F (37 C)   Labs reviewed; CBG 116-152, Na 133 Medications reviewed; Decadron, sliding scale insulin, Protonix  Diet Order:  Diet NPO time specified  Skin:  Wound (see comment) (groin puncture )  Last BM:  12/15/16  Height:   Ht Readings from Last 1 Encounters:  12/03/16 5' 7"  (1.702 m)    Weight:   Wt Readings from Last 1 Encounters:  12/16/16 146 lb 9.7 oz (66.5 kg)    Ideal Body Weight:  61.3 kg  BMI:  Body mass index is 22.96 kg/m.  Estimated Nutritional Needs:   Kcal:  1578  Protein:  100-115 gm  Fluid:  per MD  EDUCATION NEEDS:   No education needs identified at this time  [place] RangerMS, RDN, LDN 12/16/2016 2:35 PM

## 2016-12-16 NOTE — Plan of Care (Signed)
Problem: Nutrition: Goal: Adequate nutrition will be maintained Outcome: Progressing Patient remains on tube feeds at this time.   Problem: Bowel/Gastric: Goal: Will not experience complications related to bowel motility Outcome: Progressing Rectal tube in place with adequate output.

## 2016-12-16 NOTE — Progress Notes (Signed)
Pt seen and examined. No issues overnight. MRI completed.  EXAM: Temp:  [97.8 F (36.6 C)-98.6 F (37 C)] 98.2 F (36.8 C) (09/18 0800) Pulse Rate:  [63-94] 63 (09/18 0740) Resp:  [17-24] 19 (09/18 1000) BP: (125-161)/(65-91) 136/79 (09/18 1000) SpO2:  [96 %-100 %] 99 % (09/18 1000) FiO2 (%):  [30 %] 30 % (09/18 1000) Weight:  [66.5 kg (146 lb 9.7 oz)] 66.5 kg (146 lb 9.7 oz) (09/18 0100) Intake/Output      09/17 0701 - 09/18 0700 09/18 0701 - 09/19 0700   I.V. (mL/kg) 993.8 (14.9) 975 (14.7)   NG/GT 662.5 650   IV Piggyback 270    Total Intake(mL/kg) 1926.3 (29) 1625 (24.4)   Urine (mL/kg/hr) 2175 (1.4) 250 (1.1)   Drains 96 2   Stool 800    Total Output 3071 252   Net -1144.8 +1373         Grimace to pain No eye opening Pupils non-reactive Breathes spontaneously Minimal w/d BLE, no real movement BUE EVD in place  LABS: Lab Results  Component Value Date   CREATININE 0.38 (L) 12/16/2016   BUN 18 12/16/2016   NA 133 (L) 12/16/2016   K 4.5 12/16/2016   CL 105 12/16/2016   CO2 23 12/16/2016   Lab Results  Component Value Date   WBC 15.5 (H) 12/16/2016   HGB 11.1 (L) 12/16/2016   HCT 33.0 (L) 12/16/2016   MCV 91.9 12/16/2016   PLT 243 12/16/2016    IMAGING: MRI reviewed. Again demonstrates previously seen bilateral thalamic and midbrain strokes, but no new areas of infarction, hemorrhage.   IMPRESSION: - 66 y.o. female SAH d# 14 s/p coiling of basilar artery. Remains minimally conscious likely from bilateral thalamic/midbrain strokes.   PLAN: - Cont current supportive care - Family deciding on trach/PEG for long-term care v one-way extubation  Dr. Molli Knock and myself had a lengthy discussion with the patient's husband and son. I did tell them that I think the chance of recovery to the point of functional independence or "normal" is negligible. I think there may be a chance for improvement in level of consciousness, but even that seems less and less likely given  the lack of any real change over the last 10 days. Patient's family I think understand that she is not ever going to be the way she was prior to her hospitalization. All their questions were answered.

## 2016-12-17 ENCOUNTER — Inpatient Hospital Stay (HOSPITAL_COMMUNITY): Payer: Medicare Other

## 2016-12-17 DIAGNOSIS — I609 Nontraumatic subarachnoid hemorrhage, unspecified: Secondary | ICD-10-CM

## 2016-12-17 LAB — BLOOD GAS, ARTERIAL
ACID-BASE DEFICIT: 0.8 mmol/L (ref 0.0–2.0)
Bicarbonate: 22.4 mmol/L (ref 20.0–28.0)
DRAWN BY: 345601
FIO2: 30
MECHVT: 490 mL
O2 Saturation: 98.7 %
PATIENT TEMPERATURE: 98.6
PEEP/CPAP: 5 cmH2O
PH ART: 7.469 — AB (ref 7.350–7.450)
RATE: 14 resp/min
pCO2 arterial: 31.3 mmHg — ABNORMAL LOW (ref 32.0–48.0)
pO2, Arterial: 140 mmHg — ABNORMAL HIGH (ref 83.0–108.0)

## 2016-12-17 LAB — CBC WITH DIFFERENTIAL/PLATELET
BASOS PCT: 0 %
Basophils Absolute: 0 10*3/uL (ref 0.0–0.1)
EOS ABS: 0 10*3/uL (ref 0.0–0.7)
EOS PCT: 0 %
HCT: 35.7 % — ABNORMAL LOW (ref 36.0–46.0)
Hemoglobin: 11.6 g/dL — ABNORMAL LOW (ref 12.0–15.0)
Lymphocytes Relative: 6 %
Lymphs Abs: 1.1 10*3/uL (ref 0.7–4.0)
MCH: 30.1 pg (ref 26.0–34.0)
MCHC: 32.5 g/dL (ref 30.0–36.0)
MCV: 92.5 fL (ref 78.0–100.0)
MONO ABS: 0.7 10*3/uL (ref 0.1–1.0)
MONOS PCT: 4 %
Neutro Abs: 16.2 10*3/uL — ABNORMAL HIGH (ref 1.7–7.7)
Neutrophils Relative %: 90 %
Platelets: 316 10*3/uL (ref 150–400)
RBC: 3.86 MIL/uL — ABNORMAL LOW (ref 3.87–5.11)
RDW: 13.1 % (ref 11.5–15.5)
WBC: 18 10*3/uL — ABNORMAL HIGH (ref 4.0–10.5)

## 2016-12-17 LAB — GLUCOSE, CAPILLARY
GLUCOSE-CAPILLARY: 146 mg/dL — AB (ref 65–99)
Glucose-Capillary: 112 mg/dL — ABNORMAL HIGH (ref 65–99)
Glucose-Capillary: 124 mg/dL — ABNORMAL HIGH (ref 65–99)
Glucose-Capillary: 135 mg/dL — ABNORMAL HIGH (ref 65–99)
Glucose-Capillary: 144 mg/dL — ABNORMAL HIGH (ref 65–99)

## 2016-12-17 LAB — RENAL FUNCTION PANEL
ANION GAP: 8 (ref 5–15)
Albumin: 2.4 g/dL — ABNORMAL LOW (ref 3.5–5.0)
BUN: 19 mg/dL (ref 6–20)
CHLORIDE: 103 mmol/L (ref 101–111)
CO2: 23 mmol/L (ref 22–32)
Calcium: 8.4 mg/dL — ABNORMAL LOW (ref 8.9–10.3)
Creatinine, Ser: 0.38 mg/dL — ABNORMAL LOW (ref 0.44–1.00)
GFR calc Af Amer: >60 mL/min (ref >60)
GFR calc non Af Amer: >60 mL/min (ref >60)
GLUCOSE: 137 mg/dL — AB (ref 65–99)
PHOSPHORUS: 3.8 mg/dL (ref 2.5–4.6)
POTASSIUM: 4.1 mmol/L (ref 3.5–5.1)
Sodium: 134 mmol/L — ABNORMAL LOW (ref 135–145)

## 2016-12-17 LAB — MAGNESIUM: MAGNESIUM: 2.1 mg/dL (ref 1.7–2.4)

## 2016-12-17 NOTE — Progress Notes (Signed)
PULMONARY / CRITICAL CARE MEDICINE   Name: Shelby Ayala MRN: [id number] DOB: 26-Jun-1950    ADMISSION DATE:  12/03/2016 CONSULTATION DATE:  12/09/2016  REFERRING MD:  Conchita Paris  CHIEF COMPLAINT:  Acute encephalopathy  BRIEF HISTORY OF PRESENT ILLNESS:   66 y/o female who had a SAH, received coiling, post operatively had a cerebral infarct, drain placed.  Intubated.  Noted to have some aspiration pneumonia, later UTI.   SUBJECTIVE:  No events overnight, unresponsive  VITAL SIGNS: BP 131/76   Pulse 70   Temp (!) 97 F (36.1 C) (Axillary)   Resp 19   Ht  (1.702 m)   Wt 64.2 kg (141 lb 8.6 oz)   SpO2 100%   BMI 22.17 kg/m   HEMODYNAMICS: CVP:  [2 mmHg-4 mmHg] 2 mmHg  VENTILATOR SETTINGS: Vent Mode: PSV;CPAP FiO2 (%):  [30 %] 30 % Set Rate:  [14 bmp] 14 bmp Vt Set:  [490 mL] 490 mL PEEP:  [5 cmH20] 5 cmH20 Pressure Support:  [5 cmH20-8 cmH20] 5 cmH20 Plateau Pressure:  [13 cmH20] 13 cmH20  INTAKE / OUTPUT: I/O last 3 completed shifts: In: 5365 [I.V.:2775; NG/GT:2210; IV Piggyback:380] Out: [address]; Drains:32; Stool:1050]  PHYSICAL EXAMINATION:  General:  Unresponsive on vent HENNT: [place]/AT, PERRL, EOM-I and MMM PULM: CTA bilaterally, spontaneously breathing CV: RRR, Nl S1/S2, -M/R/G. GI: Soft, NT, ND and +BS MSK: WNL Neuro: No response to stimuli  LABS:  BMET  Recent Labs Lab 12/15/16 0450 12/16/16 0340 12/17/16 0328  NA 137 133* 134*  K 4.1 4.5 4.1  CL 108 105 103  CO2 BUN 21* 18 19  CREATININE 0.42* 0.38* 0.38*  GLUCOSE 139* 146* 137*   Electrolytes  Recent Labs Lab 12/15/16 0450 12/16/16 0340 12/17/16 0328  CALCIUM 8.2* 8.0* 8.4*  MG 2.2 2.1 2.1  PHOS 3.5 4.1  3.9 3.8   CBC  Recent Labs Lab 12/15/16 0450 12/16/16 0340 12/17/16 0328  WBC 14.0* 15.5* 18.0*  HGB 11.2* 11.1* 11.6*  HCT 34.0* 33.0* 35.7*  PLT 234 243 316   Coag's No results for input(s): APTT, INR in the last 168 hours.  Sepsis  Markers  Recent Labs Lab 12/11/16 0500  PROCALCITON 16.64   ABG  Recent Labs Lab 12/16/16 0410 12/17/16 0242  PHART 7.468* 7.469*  PCO2ART 31.7* 31.3*  PO2ART 143* 140*   Liver Enzymes  Recent Labs Lab 12/15/16 0450 12/16/16 0340 12/17/16 0328  ALBUMIN 2.1* 2.2* 2.4*   Cardiac Enzymes No results for input(s): TROPONINI, PROBNP in the last 168 hours.  Glucose  Recent Labs Lab 12/16/16 0755 12/16/16 1200 12/16/16 1510 12/16/16 1933 12/16/16 2321 12/17/16 0748  GLUCAP 116* 137* 136* 128* 128* 146*   Imaging No results found.  STUDIES:  MRI BRAIN W/O 9/8: IMPRESSION: 1. Status post coil embolization of basilar artery aneurysm. 2. 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 3. Subarachnoid hemorrhage and intraventricular blood products. RIGHT frontal ventriculostomy catheter without hydrocephalus. 4. Punctate parietal infarcts versus susceptibility artifact from subarachnoid hemorrhage. 5. Moderate chronic small vessel ischemic disease. Old small cerebellar infarcts. PORT CXR 9/8:  Personally reviewed by me. No focal opacity. Enteric feeding tube coursing below diaphragm. No pleural effusion appreciated. CT HEAD W/O 9/10: IMPRESSION: 1. Interval partial dispersion of subarachnoid hemorrhage. Residual hemorrhage predominantly in occipital horns of lateral ventricles and scattered over the convexities. 2. Stable position of right frontal approach ventriculostomy catheter. Stable ventricle size. No hydrocephalus. 3. Stable distribution of bilateral  medial thalamus infarcts. Infarction in upper midbrain is partially obscured by the basilar artery coil mass. 4. No new acute intracranial hemorrhage or infarct identified. EEG 9/13 >> mod diffuse slowing, neg for seizure   MICROBIOLOGY: MRSA PCR 9/5:  Negative Blood Cultures x2 9/11 >>> ngtd Urine Culture 9/11 >>> E Coli, Klebsiella cetriaxone sensitive  Tracheal Aspirate  Culture 9/11 >> MSSA  ANTIBIOTICS: Vancomycin 9/11 >>>9/12 Zosyn 9/11 >>9/13  Ceftriaxone 9/13 >>9/20  SIGNIFICANT EVENTS: 09/05 - Admit & coiling 09/11 - PCCM consult for worsening hypoxia & persistent altered mentation >> intubated  LINES/TUBES: OETT 7.5 9/11 >>> NGT >>> PIV R TL IJ CVC 9/12 >>  DISCUSSION:  66 y/o female with subarachnoid hemorrhage, course complicated by bilateral medial thalamus and midbrain infarcts, aspiraiton pneumonia, UTI and acute encephalopathy  ASSESSMENT / PLAN:  NEUROLOGIC A:   SAH Midbraine/thalamic infarct Persistent encephalopathy P:   RASS goal 0 D/Ced all sedation Ventricular drain management per NSGY Keppra, decadron per NSGY Continue nimodipine  PULMONARY A: Acute respiratory failure with hypoxemia Aspiration pneumonia P:   See family meeting notes from yesterday, recommending against trach. Will likely proceed with withdrawal in the next 24-48 hours a the most Maintain on PS as tolerated VAP prevention Daily WUA/SBT  CARDIOVASCULAR A:  S/p SAH P:  Tele Continue nimodipine per NS  RENAL A:   Hypokalemia P:   Monitor BMET and UOP Replace electrolytes as needed KVO IVF  GASTROINTESTINAL A:   No acute issues P:   Continue TF per nutrition Pantoprazole for stress ulcer prophylaxis No PEG pending family discussion  HEMATOLOGIC A:   No acute issues P:  Monitor for bleeding  INFECTIOUS A:   MSSA pneumonia UTI: ecoli/klebsiella P:   Monitor for fever Continue ceftriaxone for total of 8 days, last dose 9/20  ENDOCRINE A:   Hyperglycemia  P:   SSI  FAMILY  - Updates: Family waiting to speak with NS then will need to start making decision, made DNR yesterday.  The patient is critically ill with multiple organ systems failure and requires high complexity decision making for assessment and support, frequent evaluation and titration of therapies, application of advanced monitoring technologies and  extensive interpretation of multiple databases.   Critical Care Time devoted to patient care services described in this note is  35  Minutes. This time reflects time of care of this signee Dr Koren Bound. This critical care time does not reflect procedure time, or teaching time or supervisory time of PA/NP/Med student/Med Resident etc but could involve care discussion time.  Alyson Reedy, M.D. [facility] Pulmonary/Critical Care Medicine. Pager: [phone]. After hours pager: [phone].  12/17/2016, 8:41 AM

## 2016-12-17 NOTE — Progress Notes (Signed)
Transcranial Doppler  Date POD PCO2 HCT BP  MCA ACA PCA OPHT SIPH VERT Basilar  9/6/rs     Right  Left   45  47   -38  - 42  31   -18  -23   -35      9/7, rds     Right  Left   45  43   -49  - 38  44   -21  -20   *      9/10 rs,vs     Right  Left   57  57   -55  -46   31  *   21  18   37  42   -34  -32   -56      9/12 cb     Right  Left   43  51   -28  -33   *  47   19      9/13  vs     Right  Left   31  38   -22  -20   43  32   *   -30  -33     -32    9/17 je     Right  Left   43  54   -27  -52   34  37   -34  -39   -42      9/19 rs     Right  Left   42  47   -18  -23   27  39   27  29   40  53   -38  18   -41       MCA = Middle Cerebral Artery      OPHT = Opthalmic Artery     BASILAR = Basilar Artery   ACA = Anterior Cerebral Artery     SIPH = Carotid Siphon PCA = Posterior Cerebral Artery   VERT = Verterbral Artery                   Normal MCA = 62+\-12 ACA = 50+\-12 PCA = 42+\-23   9/7 - (*) not insonated. rds 9/10 - (*) not insonated. rds and vs. 9/12- (*) not insonated, cb 9/14 (*) not insonated, vs 9/17 Could not calculate right Linegaard ratio as cannot access patient's right neck due to lines. Left = 1.86 . je 9/19 rds.

## 2016-12-18 ENCOUNTER — Encounter: Payer: Self-pay | Admitting: Family Medicine

## 2016-12-18 ENCOUNTER — Inpatient Hospital Stay (HOSPITAL_COMMUNITY): Payer: Medicare Other

## 2016-12-18 LAB — CBC WITH DIFFERENTIAL/PLATELET
Basophils Absolute: 0 10*3/uL (ref 0.0–0.1)
Basophils Relative: 0 %
Eosinophils Absolute: 0 10*3/uL (ref 0.0–0.7)
Eosinophils Relative: 0 %
HEMATOCRIT: 31.1 % — AB (ref 36.0–46.0)
Hemoglobin: 10.1 g/dL — ABNORMAL LOW (ref 12.0–15.0)
LYMPHS ABS: 1.1 10*3/uL (ref 0.7–4.0)
LYMPHS PCT: 7 %
MCH: 30.2 pg (ref 26.0–34.0)
MCHC: 32.5 g/dL (ref 30.0–36.0)
MCV: 93.1 fL (ref 78.0–100.0)
Monocytes Absolute: 0.5 10*3/uL (ref 0.1–1.0)
Monocytes Relative: 3 %
NEUTROS PCT: 90 %
Neutro Abs: 14.3 10*3/uL — ABNORMAL HIGH (ref 1.7–7.7)
Platelets: 347 10*3/uL (ref 150–400)
RBC: 3.34 MIL/uL — AB (ref 3.87–5.11)
RDW: 13.2 % (ref 11.5–15.5)
WBC: 16 10*3/uL — AB (ref 4.0–10.5)

## 2016-12-18 LAB — BLOOD GAS, ARTERIAL
ACID-BASE EXCESS: 1.2 mmol/L (ref 0.0–2.0)
Bicarbonate: 24.5 mmol/L (ref 20.0–28.0)
DRAWN BY: 36496
FIO2: 30
MECHVT: 490 mL
O2 Saturation: 99 %
PEEP/CPAP: 5 cmH2O
Patient temperature: 97.8
RATE: 14 resp/min
pCO2 arterial: 33.4 mmHg (ref 32.0–48.0)
pH, Arterial: 7.477 — ABNORMAL HIGH (ref 7.350–7.450)
pO2, Arterial: 136 mmHg — ABNORMAL HIGH (ref 83.0–108.0)

## 2016-12-18 LAB — RENAL FUNCTION PANEL
Albumin: 2.2 g/dL — ABNORMAL LOW (ref 3.5–5.0)
Anion gap: 6 (ref 5–15)
BUN: 18 mg/dL (ref 6–20)
CALCIUM: 8.2 mg/dL — AB (ref 8.9–10.3)
CHLORIDE: 105 mmol/L (ref 101–111)
CO2: 23 mmol/L (ref 22–32)
CREATININE: 0.3 mg/dL — AB (ref 0.44–1.00)
GFR calc Af Amer: >60 mL/min (ref >60)
GFR calc non Af Amer: >60 mL/min (ref >60)
Glucose, Bld: 129 mg/dL — ABNORMAL HIGH (ref 65–99)
POTASSIUM: 3.5 mmol/L (ref 3.5–5.1)
Phosphorus: 3.8 mg/dL (ref 2.5–4.6)
Sodium: 134 mmol/L — ABNORMAL LOW (ref 135–145)

## 2016-12-18 LAB — GLUCOSE, CAPILLARY
GLUCOSE-CAPILLARY: 159 mg/dL — AB (ref 65–99)
GLUCOSE-CAPILLARY: 124 mg/dL — AB (ref 65–99)
Glucose-Capillary: 129 mg/dL — ABNORMAL HIGH (ref 65–99)
Glucose-Capillary: 146 mg/dL — ABNORMAL HIGH (ref 65–99)

## 2016-12-18 LAB — MAGNESIUM: Magnesium: 2 mg/dL (ref 1.7–2.4)

## 2016-12-18 MED ORDER — MORPHINE SULFATE 25 MG/ML IV SOLN
10.0000 mg/h | INTRAVENOUS | Status: DC
  Administered 2016-12-18: 4 mg/h via INTRAVENOUS
  Filled 2016-12-18: qty 10

## 2016-12-18 MED ORDER — MORPHINE BOLUS VIA INFUSION
5.0000 mg | INTRAVENOUS | Status: DC | PRN
  Filled 2016-12-18: qty 20

## 2016-12-18 NOTE — Progress Notes (Signed)
PULMONARY / CRITICAL CARE MEDICINE   Name: Shelby Ayala MRN: [id number] DOB: [birth year]    ADMISSION DATE:  12/03/2016 CONSULTATION DATE:  12/09/2016  REFERRING MD:  Conchita Paris  CHIEF COMPLAINT:  Acute encephalopathy  BRIEF HISTORY OF PRESENT ILLNESS:   66 y/o female who had a SAH, received coiling, post operatively had a cerebral infarct, drain placed.  Intubated.  Noted to have some aspiration pneumonia, later UTI.   SUBJECTIVE:  No events overnight, remains unresponsive and weaning  VITAL SIGNS: BP 127/60   Pulse 64   Temp 97.8 F (36.6 C) (Axillary)   Resp 17   Ht  (1.702 m)   Wt 63.3 kg (139 lb 8.8 oz)   SpO2 99%   BMI 21.86 kg/m   HEMODYNAMICS:    VENTILATOR SETTINGS: Vent Mode: PRVC FiO2 (%):  [30 %] 30 % Set Rate:  [14 bmp] 14 bmp Vt Set:  [490 mL] 490 mL PEEP:  [5 cmH20] 5 cmH20 Pressure Support:  [5 cmH20-8 cmH20] 8 cmH20 Plateau Pressure:  [12 cmH20-13 cmH20] 13 cmH20  INTAKE / OUTPUT: I/O last 3 completed shifts: In: 5130 [I.V.:2775; NG/GT:2085; IV Piggyback:270] Out: 5292 [Urine:3770; Drains:22; Stool:1500]  PHYSICAL EXAMINATION:  General:  Remains off sedation and unresponsive on the vent HENNT: [place]/AT, drain in place PULM: CTA bilaterally CV: RRR, Nl S1/S2, -M/R/G GI: Soft, NT, ND and +BS Neuro: Unresponsive to stimuli  LABS:  BMET  Recent Labs Lab 12/16/16 0340 12/17/16 0328 12/18/16 0230  NA 133* 134* 134*  K 4.5 4.1 3.5  CL 105 103 105  CO2 BUN CREATININE 0.38* 0.38* 0.30*  GLUCOSE 146* 137* 129*   Electrolytes  Recent Labs Lab 12/16/16 0340 12/17/16 0328 12/18/16 0230  CALCIUM 8.0* 8.4* 8.2*  MG 2.1 2.1 2.0  PHOS 4.1  3.9 3.8 3.8   CBC  Recent Labs Lab 12/16/16 0340 12/17/16 0328 12/18/16 0230  WBC 15.5* 18.0* 16.0*  HGB 11.1* 11.6* 10.1*  HCT 33.0* 35.7* 31.1*  PLT 243 316 347   Coag's No results for input(s): APTT, INR in the last 168 hours.  Sepsis Markers No results for  input(s): LATICACIDVEN, PROCALCITON, O2SATVEN in the last 168 hours. ABG  Recent Labs Lab 12/16/16 0410 12/17/16 0242 12/18/16 0455  PHART 7.468* 7.469* 7.477*  PCO2ART 31.7* 31.3* 33.4  PO2ART 143* 140* 136*   Liver Enzymes  Recent Labs Lab 12/16/16 0340 12/17/16 0328 12/18/16 0230  ALBUMIN 2.2* 2.4* 2.2*   Cardiac Enzymes No results for input(s): TROPONINI, PROBNP in the last 168 hours.  Glucose  Recent Labs Lab 12/17/16 1543 12/17/16 2015 12/17/16 2331 12/18/16 0334 12/18/16 0347 12/18/16 0820  GLUCAP 144* 135* 112* 146* 159* 124*   Imaging Dg Chest Port 1 View  Result Date: 12/18/2016 CLINICAL DATA:  Hypoxia EXAM: PORTABLE CHEST 1 VIEW COMPARISON:  December 17, 2016 FINDINGS: Endotracheal tube tip is 3.1 cm above the carina. Nasogastric tube tip and side port are below the diaphragm. No pneumothorax. There is no edema or consolidation. Heart size and pulmonary vascularity are normal. No adenopathy. There is aortic atherosclerosis. There is thoracolumbar dextroscoliosis. IMPRESSION: Tube positions as described without evident pneumothorax. No edema or consolidation. There is aortic atherosclerosis. Aortic Atherosclerosis (ICD10-I70.0). Electronically Signed   By: Bretta Bang III M.D.   On: 12/18/2016 08:00    STUDIES:  MRI BRAIN W/O 9/8: IMPRESSION: 1. Status post coil embolization of basilar artery aneurysm. 2. Acute small posterior circulation infarcts including  bilateral thalamus and midbrain. 3. Subarachnoid hemorrhage and intraventricular blood products. RIGHT frontal ventriculostomy catheter without hydrocephalus. 4. Punctate parietal infarcts versus susceptibility artifact from subarachnoid hemorrhage. 5. Moderate chronic small vessel ischemic disease. Old small cerebellar infarcts. PORT CXR 9/8:  Personally reviewed by me. No focal opacity. Enteric feeding tube coursing below diaphragm. No pleural effusion appreciated. CT HEAD W/O  9/10: IMPRESSION: 1. Interval partial dispersion of subarachnoid hemorrhage. Residual hemorrhage predominantly in occipital horns of lateral ventricles and scattered over the convexities. 2. Stable position of right frontal approach ventriculostomy catheter. Stable ventricle size. No hydrocephalus. 3. Stable distribution of bilateral medial thalamus infarcts. Infarction in upper midbrain is partially obscured by the basilar artery coil mass. 4. No new acute intracranial hemorrhage or infarct identified. EEG 9/13 >> mod diffuse slowing, neg for seizure   MICROBIOLOGY: MRSA PCR 9/5:  Negative Blood Cultures x2 9/11 >>> ngtd Urine Culture 9/11 >>> E Coli, Klebsiella cetriaxone sensitive  Tracheal Aspirate Culture 9/11 >> MSSA  ANTIBIOTICS: Vancomycin 9/11 >>>9/12 Zosyn 9/11 >>9/13  Ceftriaxone 9/13 >>9/20  SIGNIFICANT EVENTS: 09/05 - Admit & coiling 09/11 - PCCM consult for worsening hypoxia & persistent altered mentation >> intubated  LINES/TUBES: OETT 7.5 9/11 >>> NGT >>> PIV R TL IJ CVC 9/12 >>  DISCUSSION:  66 y/o female with subarachnoid hemorrhage, course complicated by bilateral medial thalamus and midbrain infarcts, aspiraiton pneumonia, UTI and acute encephalopathy  ASSESSMENT / PLAN:  NEUROLOGIC A:   SAH Midbraine/thalamic infarct Persistent encephalopathy P:   Begin morphine today and wait for family arrival for withdrawal.  PULMONARY A: Acute respiratory failure with hypoxemia Aspiration pneumonia P:   Maintain on full vent support til family is ready for withdrawal.  CARDIOVASCULAR A:  S/p SAH P:  D/C cardene Tele monitoring  RENAL A:   Hypokalemia P:   D/C further blood draws  GASTROINTESTINAL A:   No acute issues P:   D/C TF  HEMATOLOGIC A:   No acute issues P:  D/C further blood draws  INFECTIOUS A:   MSSA pneumonia UTI: ecoli/klebsiella P:   D/C abx  ENDOCRINE A:   Hyperglycemia  P:   D/C SSI and CBG  FAMILY  -  Updates: Spoke with family at length, they are awaiting a niece from [place] to arrive then will be ready for withdrawal in AM.  Will start morphine now and maintain as DNR and likely withdraw mechanical ventilation in AM.  The patient is critically ill with multiple organ systems failure and requires high complexity decision making for assessment and support, frequent evaluation and titration of therapies, application of advanced monitoring technologies and extensive interpretation of multiple databases.   Critical Care Time devoted to patient care services described in this note is  35  Minutes. This time reflects time of care of this signee Dr Koren Bound. This critical care time does not reflect procedure time, or teaching time or supervisory time of PA/NP/Med student/Med Resident etc but could involve care discussion time.  Alyson Reedy, M.D. [facility] Pulmonary/Critical Care Medicine. Pager: [phone]. After hours pager: [phone].  12/18/2016, 9:41 AM

## 2016-12-18 NOTE — Progress Notes (Signed)
Responded to [facility] Consult to continue support to husband and daughter at bedside. Family plans to withdraw life support tomorrow around 10 am.  Family pastor involved.  Provided emotional, spiritual and grief support to family. Spoke with both husband and daughter who are both accepting and celebrating their love one.  Husband said they were honoring her wishes. Chaplain will follow as needed.    12/18/16 1112  Clinical Encounter Type  Visited With Family;Patient and family together;Health care provider  Visit Type Follow-up;Spiritual support;Patient actively dying  Referral From Nurse  Spiritual Encounters  Spiritual Needs Prayer;Emotional;Grief support  Stress Factors  Family Stress Factors Loss;Major life changes  Fae Pippin, [facility], Pager [phone]

## 2016-12-18 NOTE — Progress Notes (Signed)
RN called d/t pt w/ low RR, pt was placed back on full vent support.  Unit RT aware.

## 2016-12-19 MED ORDER — SODIUM CHLORIDE 0.9 % IV SOLN
10.0000 mg/h | INTRAVENOUS | Status: DC
  Administered 2016-12-20: 4 mg/h via INTRAVENOUS
  Filled 2016-12-19 (×2): qty 10

## 2016-12-19 MED ORDER — MORPHINE BOLUS VIA INFUSION
5.0000 mg | INTRAVENOUS | Status: DC | PRN
  Filled 2016-12-19: qty 20

## 2016-12-19 NOTE — Progress Notes (Signed)
Pt seen and examined. No issues overnight. Family at bedside  EXAM: Temp:  [96.9 F (36.1 C)-98.1 F (36.7 C)] 96.9 F (36.1 C) (09/21 0800) Pulse Rate:  [51-64] 51 (09/21 0809) Resp:  [11-19] 14 (09/21 0900) BP: (96-142)/(55-77) 96/58 (09/21 0900) SpO2:  [98 %-100 %] 100 % (09/21 0900) FiO2 (%):  [30 %] 30 % (09/21 0900) Intake/Output      09/20 0701 - 09/21 0700 09/21 0701 - 09/22 0700   I.V. (mL/kg) 376.3 (5.9) 12 (0.2)   NG/GT 200    Total Intake(mL/kg) 576.3 (9.1) 12 (0.2)   Urine (mL/kg/hr) 1555 (1)    Drains 50 4   Total Output 1605 4   Net -1028.7 +8         No eye opening Breathing over vent Minimal grimace to pain No movement BUE Minimal w/d BLE EVD in place  LABS: Lab Results  Component Value Date   CREATININE 0.30 (L) 12/18/2016   BUN 18 12/18/2016   NA 134 (L) 12/18/2016   K 3.5 12/18/2016   CL 105 12/18/2016   CO2 23 12/18/2016   Lab Results  Component Value Date   WBC 16.0 (H) 12/18/2016   HGB 10.1 (L) 12/18/2016   HCT 31.1 (L) 12/18/2016   MCV 93.1 12/18/2016   PLT 347 12/18/2016    IMPRESSION: - 66 y.o. female s/p SAH and coiling of basilar aneurysm with multiple mesencephalic/diencephalic strokes. Has remained minimally conscious for the last 2 weeks without any significant sign of improvement. I continue to believe the chance of recovery to the point of independence is negligible.  PLAN: - Will cont supportive care, family has decided on one-way extubation likely tomorrow

## 2016-12-19 NOTE — Progress Notes (Addendum)
PULMONARY / CRITICAL CARE MEDICINE   Name: Shelby Ayala MRN: [id number] DOB: [birth year]    ADMISSION DATE:  12/03/2016 CONSULTATION DATE:  12/09/2016  REFERRING MD:  Conchita Paris  CHIEF COMPLAINT:  Acute encephalopathy  BRIEF HISTORY OF PRESENT ILLNESS:   66 y/o female who had a SAH, received coiling, post operatively had a cerebral infarct, drain placed.  Intubated.  Noted to have some aspiration pneumonia, later UTI.   SUBJECTIVE:  No events overnight, patient is on morphine and appears very comfortable.  VITAL SIGNS: BP (!) 97/55   Pulse (!) 51   Temp (!) 97.5 F (36.4 C) (Axillary)   Resp 14   Ht  (1.702 m)   Wt 63.3 kg (139 lb 8.8 oz)   SpO2 100%   BMI 21.86 kg/m   HEMODYNAMICS:    VENTILATOR SETTINGS: Vent Mode: PRVC FiO2 (%):  [30 %] 30 % Set Rate:  [14 bmp] 14 bmp Vt Set:  [490 mL] 490 mL PEEP:  [5 cmH20] 5 cmH20 Pressure Support:  [8 cmH20] 8 cmH20 Plateau Pressure:  [11 cmH20-18 cmH20] 14 cmH20  INTAKE / OUTPUT: I/O last 3 completed shifts: In: 2061.3 [I.V.:1276.3; NG/GT:675; IV Piggyback:110] Out: 2671 [Urine:2210; Drains:61; Stool:400]  PHYSICAL EXAMINATION:  General:  On morphine, comfortable HENNT: [place]/AT, drain in place PULM: CTA bilaterally on full vent support CV: RRR, Nl S1/S2, -M/R/G GI: Soft, NT, ND and +BS Neuro: Unresponsive to stimuli  LABS:  BMET  Recent Labs Lab 12/16/16 0340 12/17/16 0328 12/18/16 0230  NA 133* 134* 134*  K 4.5 4.1 3.5  CL 105 103 105  CO2 BUN CREATININE 0.38* 0.38* 0.30*  GLUCOSE 146* 137* 129*   Electrolytes  Recent Labs Lab 12/16/16 0340 12/17/16 0328 12/18/16 0230  CALCIUM 8.0* 8.4* 8.2*  MG 2.1 2.1 2.0  PHOS 4.1  3.9 3.8 3.8   CBC  Recent Labs Lab 12/16/16 0340 12/17/16 0328 12/18/16 0230  WBC 15.5* 18.0* 16.0*  HGB 11.1* 11.6* 10.1*  HCT 33.0* 35.7* 31.1*  PLT 243 316 347   Coag's No results for input(s): APTT, INR in the last 168 hours.  Sepsis  Markers No results for input(s): LATICACIDVEN, PROCALCITON, O2SATVEN in the last 168 hours. ABG  Recent Labs Lab 12/16/16 0410 12/17/16 0242 12/18/16 0455  PHART 7.468* 7.469* 7.477*  PCO2ART 31.7* 31.3* 33.4  PO2ART 143* 140* 136*   Liver Enzymes  Recent Labs Lab 12/16/16 0340 12/17/16 0328 12/18/16 0230  ALBUMIN 2.2* 2.4* 2.2*   Cardiac Enzymes No results for input(s): TROPONINI, PROBNP in the last 168 hours.  Glucose  Recent Labs Lab 12/17/16 1543 12/17/16 2015 12/17/16 2331 12/18/16 0334 12/18/16 0347 12/18/16 0820  GLUCAP 144* 135* 112* 146* 159* 124*   Imaging No results found.  STUDIES:  MRI BRAIN W/O 9/8: IMPRESSION: 1. Status post coil embolization of basilar artery aneurysm. 2. 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 3. Subarachnoid hemorrhage and intraventricular blood products. RIGHT frontal ventriculostomy catheter without hydrocephalus. 4. Punctate parietal infarcts versus susceptibility artifact from subarachnoid hemorrhage. 5. Moderate chronic small vessel ischemic disease. Old small cerebellar infarcts. PORT CXR 9/8:  Personally reviewed by me. No focal opacity. Enteric feeding tube coursing below diaphragm. No pleural effusion appreciated. CT HEAD W/O 9/10: IMPRESSION: 1. Interval partial dispersion of subarachnoid hemorrhage. Residual hemorrhage predominantly in occipital horns of lateral ventricles and scattered over the convexities. 2. Stable position of right frontal approach ventriculostomy catheter. Stable ventricle  size. No hydrocephalus. 3. Stable distribution of bilateral medial thalamus infarcts. Infarction in upper midbrain is partially obscured by the basilar artery coil mass. 4. No new acute intracranial hemorrhage or infarct identified. EEG 9/13 >> mod diffuse slowing, neg for seizure   MICROBIOLOGY: MRSA PCR 9/5:  Negative Blood Cultures x2 9/11 >>> ngtd Urine Culture 9/11 >>> E  Coli, Klebsiella cetriaxone sensitive  Tracheal Aspirate Culture 9/11 >> MSSA  ANTIBIOTICS: Vancomycin 9/11 >>>9/12 Zosyn 9/11 >>9/13  Ceftriaxone 9/13 >>9/20  SIGNIFICANT EVENTS: 09/05 - Admit & coiling 09/11 - PCCM consult for worsening hypoxia & persistent altered mentation >> intubated  LINES/TUBES: OETT 7.5 9/11 >>> NGT >>> PIV R TL IJ CVC 9/12 >>  DISCUSSION:  66 y/o female with subarachnoid hemorrhage, course complicated by bilateral medial thalamus and midbrain infarcts, aspiraiton pneumonia, UTI and acute encephalopathy  ASSESSMENT / PLAN:  NEUROLOGIC A:   SAH Midbraine/thalamic infarct Persistent encephalopathy P:   Continue morphine, increase for comfort  PULMONARY A: Acute respiratory failure with hypoxemia Aspiration pneumonia P:   Maintain on full vent support til family is ready for withdrawal at 10 AM today  CARDIOVASCULAR A:  S/p SAH P:  D/C cardene D/C tele  RENAL A:   Hypokalemia P:   D/C further blood draws  GASTROINTESTINAL A:   No acute issues P:   D/C TF  HEMATOLOGIC A:   No acute issues P:  D/C further blood draws  INFECTIOUS A:   MSSA pneumonia UTI: ecoli/klebsiella P:   D/C abx  ENDOCRINE A:   Hyperglycemia  P:   D/C SSI and CBG  FAMILY  - Updates: Spoke with the husband, all family members are here, ready for withdrawal at 10 AM, will place orders in and extubate when family is ready around 10 AM today.  NS informed.  PCCM will sign off, please call back if needed.  The patient is critically ill with multiple organ systems failure and requires high complexity decision making for assessment and support, frequent evaluation and titration of therapies, application of advanced monitoring technologies and extensive interpretation of multiple databases.   Critical Care Time devoted to patient care services described in this note is  35  Minutes. This time reflects time of care of this signee Dr Koren Bound. This  critical care time does not reflect procedure time, or teaching time or supervisory time of PA/NP/Med student/Med Resident etc but could involve care discussion time.  Alyson Reedy, M.D. [facility] Pulmonary/Critical Care Medicine. Pager: [phone]. After hours pager: [phone].  12/19/2016, 8:43 AM

## 2016-12-19 NOTE — Progress Notes (Signed)
Responded to [facility] Consult  As well as following up from previous visits to continue support to family at bedside.  Family is withdrawing life support today.  Provided  Listening,emotional, spiritual and grief support to family,and supported family Renato Gails as needed. Offered guidance and  final blessing.   12/19/16 1008  Clinical Encounter Type  Visited With Patient and family together;Health care provider  Visit Type Follow-up;Spiritual support;Patient actively dying  Spiritual Encounters  Spiritual Needs Emotional;Grief support  Stress Factors  Family Stress Factors Loss  Fae Pippin, [facility], Pager [phone]

## 2016-12-19 NOTE — Procedures (Signed)
Extubation Procedure Note  Patient Details:   Name: Shelby Ayala No DOB: 27-Feb-1951 MRN: [id number]   Airway Documentation:     Evaluation  O2 sats: currently acceptable Complications: No apparent complications Patient did tolerate procedure well. Bilateral Breath Sounds: Clear   No   Terminal extubation done at this time. Extubated to Room Air  [name] 12/19/2016, 10:53 AM

## 2016-12-19 NOTE — Progress Notes (Signed)
CSW consulted for Comfort CARE. CSW spoke with RN Herbert Seta) to see what family needs are as well as to offer support to family. Herbert Seta confirmed that family is doing okay at the moment. CSW will continue to offer support to family as requested.     Claude Manges Lanna Labella, MSW, LCSW-A Emergency Department Clinical Social Worker [phone]

## 2016-12-19 NOTE — Progress Notes (Signed)
Pt extubated this am, has been breathing spontaneously. Does not appear in any discomfort. EVD remains in place. I spoke with the family, they appear comfortable with the decision to extubate. Will leave the EVD in place for now, if she does not pass over the next day, will discontinue.

## 2016-12-20 NOTE — Progress Notes (Signed)
Pt seen this am with family at bedside. She continues to have agonal respirations. Evd remains in place. I spoke with her family and removed the evd. All their questions were answered. May transfer her to the palliative care floor today if bed is needed.

## 2016-12-21 MED ORDER — WHITE PETROLATUM GEL
Status: AC
  Filled 2016-12-21: qty 1

## 2016-12-22 NOTE — Progress Notes (Signed)
Report called to nurse at [facility] place. Patient's family advised that [facility] was called, awaiting transport arrival before stopping morphine drip and removing PIV.

## 2016-12-22 NOTE — Progress Notes (Signed)
Remainder of morphine was wasted with 6N RN Technical brewer. There was an estimated amount of 50-65cc left in bag.

## 2016-12-22 NOTE — Care Management Important Message (Signed)
Important Message  Patient Details  Name: Shelby Ayala MRN: [id number] Date of Birth: 1950-12-27   Medicare Important Message Given:  Yes    Dorena Bodo 12/22/2016, 2:21 PM

## 2016-12-22 NOTE — Progress Notes (Signed)
Patient's son was concerned with change in patient's breathing pattern and increase in gurgling breath sounds. Obtained VS on patient, repositioned patient, and performed oral care with suctioning. Notified MD via neurosurgery on-call answering service concerning increased gurgling with patient's breathing. Awaiting return call.

## 2016-12-22 NOTE — Progress Notes (Signed)
Patient will discharge to [facility]. Anticipated discharge date: 12/22/16 Family notified: Talmadge Coventry, spouse at bedside Transportation by: [facility]  Nurse to call report to [phone].   CSW signing off.  Abigail Butts, LCSWA  Clinical Social Worker

## 2016-12-22 NOTE — Progress Notes (Signed)
Nutrition Brief Note  Chart reviewed. Pt now transitioning to comfort care.  No further nutrition interventions warranted at this time.  Please re-consult as needed.   Donna Snooks A. Kasheem Toner, RD, LDN, CDE Pager: [phone] After hours Pager: [phone]  

## 2016-12-22 NOTE — Discharge Summary (Signed)
  Physician Discharge Summary  Patient ID: Shelby Ayala MRN: [id number] DOB/AGE: 1951-01-11 66 y.o.  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 Unfortunately she never became responsive or interactive despite about two weeks of observation/supportive care. Repeat MRI continued to show the above strokes. After lengthy discussion with family, critical care, myself, and Dr. Franky Macho, family elected for one-way extubation. She remained neurologically unchanged but was able to breathe without the vent and was ultimately transferred to [facility] place for hospice care.  Treatments: Surgery - Coiling of basilar apex aneurysm  Discharge Exam: Blood pressure (!) 182/92, pulse 100, temperature 99.7 F (37.6 C), temperature source Oral, resp. rate 16, height  (1.702 m), weight 63.3 kg (139 lb 8.8 oz), SpO2 (!) 84 %. Awake, alert, oriented Speech fluent, appropriate CN grossly intact 5/5 BUE/BLE Wound c/d/i  Disposition: [facility]   Allergies as of 12/22/2016      Reactions   Sulfa Antibiotics Hives   Latex Rash      Medication List    STOP taking these medications   CALCIUM-VITAMIN D PO   ciprofloxacin-dexamethasone OTIC suspension Commonly known as:  CIPRODEX   zolpidem 10 MG tablet Commonly known as:  AMBIEN        SignedConchita Paris, Felix Meras,  C 12/22/2016, 1:02 PM

## 2016-12-22 NOTE — Progress Notes (Addendum)
[facility] Registration Visit at 11:30am  Received request from [place] for family interest in [facility] with request for transfer today. Chart reviewed and received report from bedside RN [place]. Met with spouse Clair Gulling and family to confirm interest and explain services. Family agreeable to transfer today. CSW made aware. Registration paper work completed . Dr. Orpah Melter to assume care per family request.  Please fax discharge summary to [phone]. RN please call report to [phone] and remove PIV before discharge. Please arrange transport for patient to arrive as soon as possible.   Thank you,  Gar Ponto, RN [facility] Liaison  [place] found on AMION

## 2016-12-22 NOTE — Clinical Social Work Note (Addendum)
CSW made referral to [facility]--awaiting response.   Pt approved for [facility] today. Family agreeable to transport today. CSW will fax d/c summary once provided.   [place], [place] [phone]

## 2016-12-29 DEATH — deceased

## 2018-03-13 IMAGING — DX DG CHEST 1V PORT
1 series · 1 of 1 positions shown · non-contrast
Comparison: [DATE]/ [DATE] .  [DATE]/ [DATE] .

CLINICAL DATA: Intubation.  Brain hemorrhage.

EXAM:
PORTABLE CHEST 1 VIEW

[chest]
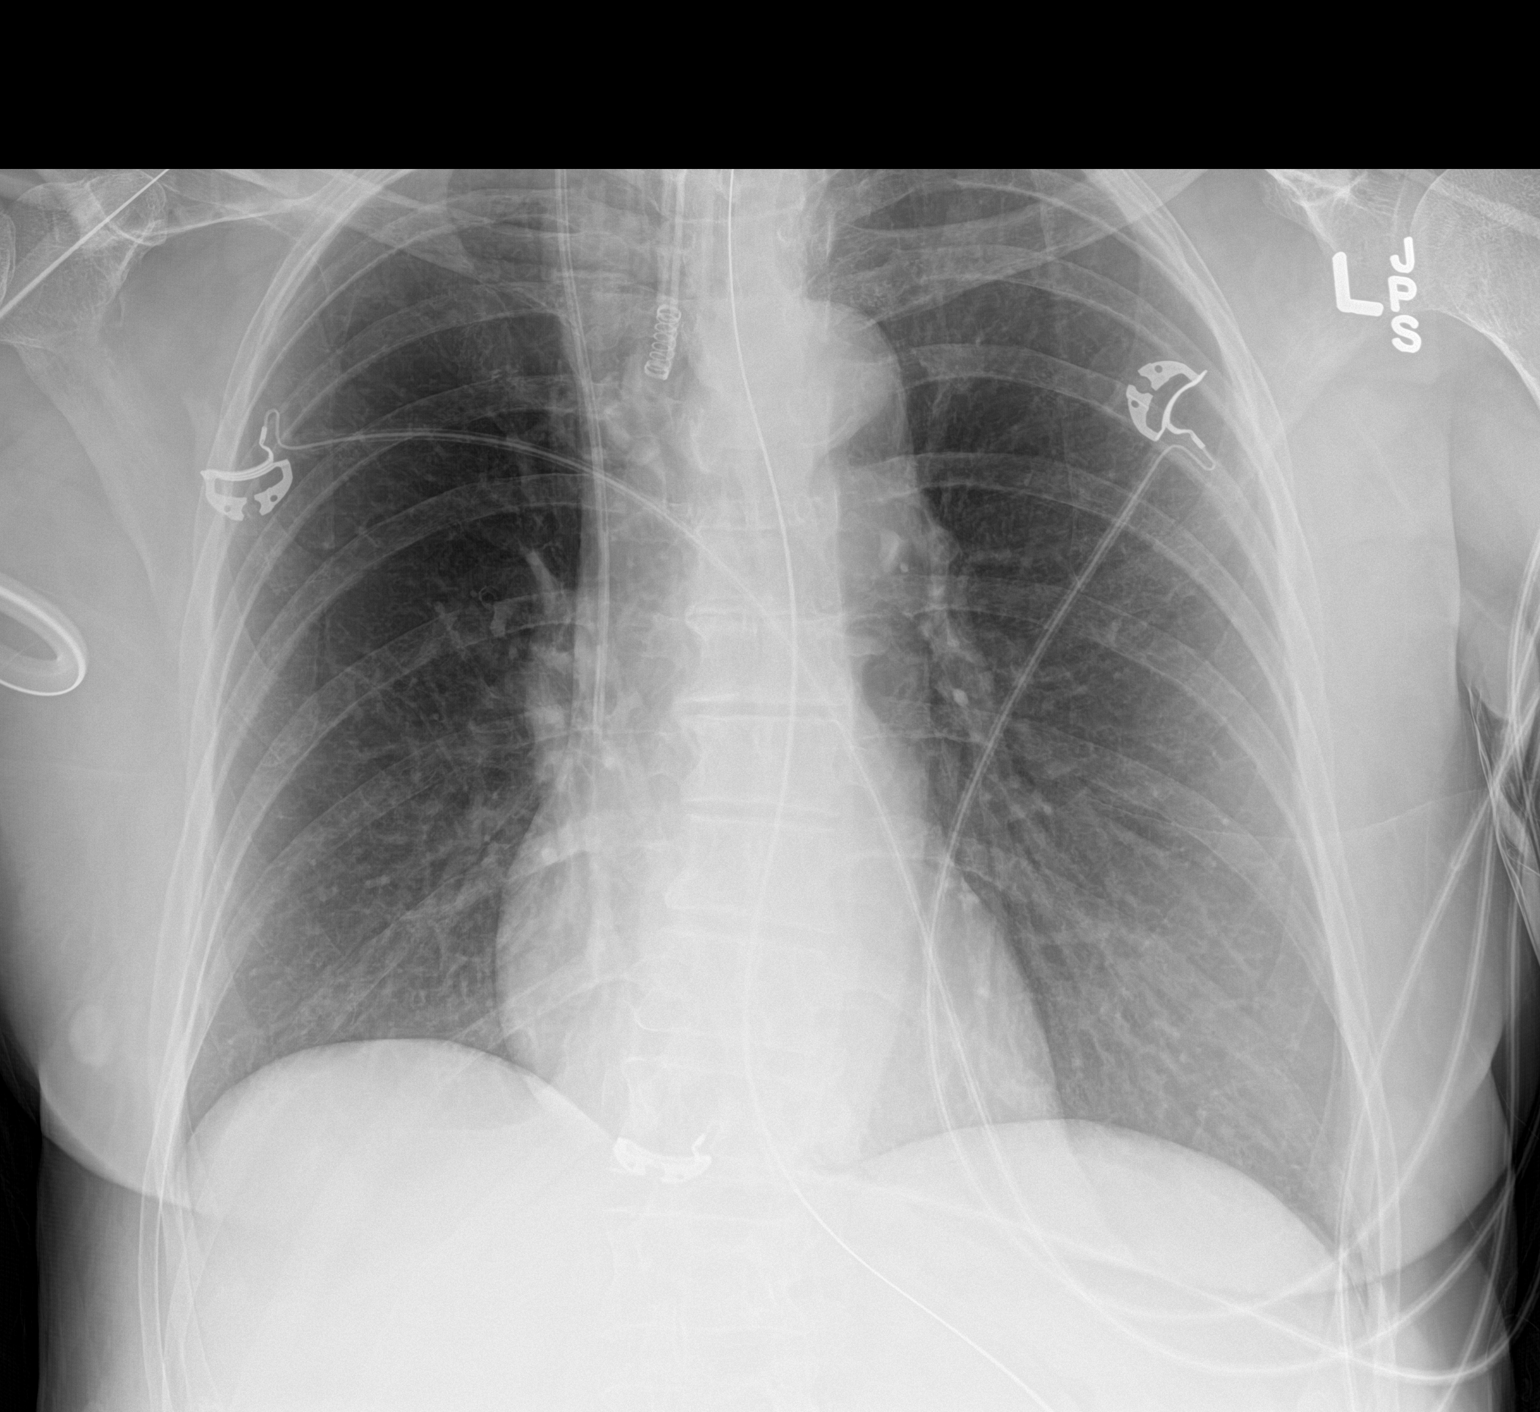

[1 of 1 positions shown; findings below may reference images not displayed]

FINDINGS: Endotracheal tube, NG tube, right IJ line in stable position. Heart
size normal. No focal infiltrate. No pleural effusion or
pneumothorax.
IMPRESSION: 1. Lines and tubes in stable position.

2.  Cardiomegaly with normal pulmonary vascularity.
